# Patient Record
Sex: Female | Born: 1995 | Race: Black or African American | Hispanic: No | Marital: Single | Smoking: Never smoker
Health system: Southern US, Community
[De-identification: ages and names within clinical notes are randomized; demographics above are authoritative.]

## PROBLEM LIST (undated history)

## (undated) DIAGNOSIS — J45909 Unspecified asthma, uncomplicated: Secondary | ICD-10-CM

## (undated) DIAGNOSIS — D649 Anemia, unspecified: Secondary | ICD-10-CM

## (undated) DIAGNOSIS — E161 Other hypoglycemia: Secondary | ICD-10-CM

## (undated) DIAGNOSIS — F329 Major depressive disorder, single episode, unspecified: Secondary | ICD-10-CM

## (undated) DIAGNOSIS — M1990 Unspecified osteoarthritis, unspecified site: Secondary | ICD-10-CM

## (undated) DIAGNOSIS — F419 Anxiety disorder, unspecified: Secondary | ICD-10-CM

## (undated) DIAGNOSIS — F32A Depression, unspecified: Secondary | ICD-10-CM

## (undated) HISTORY — DX: Other hypoglycemia: E16.1

## (undated) HISTORY — PX: TONSILLECTOMY: SUR1361

## (undated) HISTORY — PX: WISDOM TOOTH EXTRACTION: SHX21

---
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
## 2017-07-18 ENCOUNTER — Emergency Department (HOSPITAL_COMMUNITY)
Admission: EM | Admit: 2017-07-18 | Discharge: 2017-07-18 | Disposition: A | Discharge status: Home / Self Care | Payer: BLUE CROSS/BLUE SHIELD | Attending: Emergency Medicine | Admitting: Emergency Medicine

## 2017-07-18 ENCOUNTER — Other Ambulatory Visit: Payer: Self-pay

## 2017-07-18 ENCOUNTER — Emergency Department (HOSPITAL_COMMUNITY): Payer: BLUE CROSS/BLUE SHIELD

## 2017-07-18 ENCOUNTER — Encounter (HOSPITAL_COMMUNITY): Payer: Self-pay | Admitting: *Deleted

## 2017-07-18 DIAGNOSIS — Z79899 Other long term (current) drug therapy: Secondary | ICD-10-CM | POA: Diagnosis not present

## 2017-07-18 DIAGNOSIS — M546 Pain in thoracic spine: Secondary | ICD-10-CM | POA: Diagnosis not present

## 2017-07-18 HISTORY — DX: Unspecified osteoarthritis, unspecified site: M19.90

## 2017-07-18 NOTE — ED Provider Notes (Signed)
[facility]-EMERGENCY DEPT Provider Note   CSN: [id number] Arrival date & time: 07/18/17  0859     History   Chief Complaint Chief Complaint  Patient presents with  . Back Pain    HPI Laura SparkLauryn Cowan is a 21 y.o. female.  HPI Patient presents with mid back pain.  Developed last night.  Worse with certain movements.  Somewhat worse with breathing.  No swelling in her legs.  Does not smoke.  No injury.  No different physical activity.  Has not really had pains like this before.  Has been persistent since yesterday. Past Medical History:  Diagnosis Date  . Arthritis     There are no active problems to display for this patient.   Past Surgical History:  Procedure Laterality Date  . TONSILLECTOMY      OB History    No data available       Home Medications    Prior to Admission medications   Medication Sig Start Date End Date Taking? Authorizing Provider  CAMRESE 0.15-0.03 &0.01 MG tablet Take 1 tablet daily by mouth. 05/12/17  Yes [provider]    Family History No family history on file.  Social History Social History   Tobacco Use  . Smoking status: Never Smoker  . Smokeless tobacco: Never Used  Substance Use Topics  . Alcohol use: No  . Drug use: No     Allergies   Nexplanon [etonogestrel]   Review of Systems Review of Systems  Constitutional: Positive for appetite change. Negative for fever.  HENT: Negative for congestion.   Respiratory: Negative for shortness of breath.   Cardiovascular: Negative for chest pain.  Gastrointestinal: Negative for abdominal pain.  Genitourinary: Negative for flank pain.  Musculoskeletal: Positive for back pain. Negative for joint swelling.  Skin: Negative for rash.  Neurological: Negative for seizures.  Hematological: Negative for adenopathy.  Psychiatric/Behavioral: Negative for confusion.     Physical Exam Updated Vital Signs BP 132/77 (BP Location: Left Arm)   Pulse 94    Temp 98.4 F (36.9 C) (Oral)   Resp 16   Ht 5\' 5"  (1.651 m)   Wt 136.1 kg (300 lb)   LMP 07/04/2017   SpO2 100%   BMI 49.92 kg/m   Physical Exam  Constitutional:  Patient is obese  HENT:  Head: Atraumatic.  Eyes: Pupils are equal, round, and reactive to light.  Neck: Neck supple.  Pulmonary/Chest: She exhibits no tenderness.  Abdominal: Soft. There is no tenderness.  Musculoskeletal: She exhibits tenderness. She exhibits no edema.  Tenderness over mid thoracic spine.  No deformity.  No rash.  No paraspinal tenderness.  Skin: Skin is warm. Capillary refill takes less than 2 seconds.     ED Treatments / Results  Labs (all labs ordered are listed, but only abnormal results are displayed) Labs Reviewed - No data to display  EKG  EKG Interpretation None       Radiology Dg Chest 2 View  Result Date: 07/18/2017 CLINICAL DATA:  Mid back pain for the past 2 days.  No known injury. EXAM: CHEST  2 VIEW COMPARISON:  None. FINDINGS: The heart size and mediastinal contours are within normal limits. Both lungs are clear. The visualized skeletal structures are unremarkable. IMPRESSION: Normal examination. Electronically Signed   By: Beckie SaltsSteven  Reid M.D.   On: 07/18/2017 13:26    Procedures Procedures (including critical care time)  Medications Ordered in ED Medications - No data to display   Initial Impression /  Assessment and Plan / ED Course  I have reviewed the triage vital signs and the nursing notes.  Pertinent labs & imaging results that were available during my care of the patient were reviewed by me and considered in my medical decision making (see chart for details).     Patient with mid back pain.  X-ray reassuring.  Not hypoxic.  Not tachycardic.  Worse with palpation of the spine.  No fevers.  Discussed with patient that this is likely musculoskeletal back pain.  Patient was going to be discharged home but eloped before she got her paperwork.  Final Clinical  Impressions(s) / ED Diagnoses   Final diagnoses:  Acute midline thoracic back pain    New Prescriptions This SmartLink is deprecated. Use AVSMEDLIST instead to display the medication list for a patient.   Benjiman Core, MD 07/18/17 1436

## 2017-07-18 NOTE — ED Notes (Signed)
Called for room placement x 2 with no answer.

## 2017-07-18 NOTE — ED Triage Notes (Addendum)
EMS states pt developed mid to upper back back pain last night, no injury

## 2017-07-18 NOTE — ED Notes (Signed)
Patient eloped with out waiting for discharged paper.

## 2018-06-14 DIAGNOSIS — E161 Other hypoglycemia: Secondary | ICD-10-CM

## 2018-06-14 HISTORY — DX: Other hypoglycemia: E16.1

## 2018-09-10 ENCOUNTER — Ambulatory Visit (HOSPITAL_COMMUNITY)
Admission: RE | Admit: 2018-09-10 | Discharge: 2018-09-10 | Disposition: A | Discharge status: Home / Self Care | Payer: BLUE CROSS/BLUE SHIELD | Attending: Psychiatry | Admitting: Psychiatry

## 2018-09-10 DIAGNOSIS — Z888 Allergy status to other drugs, medicaments and biological substances status: Secondary | ICD-10-CM | POA: Diagnosis not present

## 2018-09-10 DIAGNOSIS — F12929 Cannabis use, unspecified with intoxication, unspecified: Secondary | ICD-10-CM | POA: Diagnosis not present

## 2018-09-10 DIAGNOSIS — F339 Major depressive disorder, recurrent, unspecified: Secondary | ICD-10-CM | POA: Insufficient documentation

## 2018-09-10 NOTE — BH Assessment (Signed)
Assessment Note  Laura Cowan is an 22 y.o. female who was brought to [facility] by her Mother, Jenisha Faison.  Patient presented orientated x4, mood "sad", affect depressed and sad.  Patient denied current SI, HI, AVH.  Patient reports last episode of SI with no intent or plans was approximately a month ago when she was feeling "very sad."  Patient and Mother denied a history of SI attempts or inpatient psychiatric hospitalizations.  Patient reports starting to feel sad in 2014 when her Step Mother remarried her Father and did not tell her.  She reports having a very close relationship with Step Mother since childhood while visiting during the summer.  The Patient also reports moving  from another state with a Friend to attend [employer].  Patient reports her Friend transferred to another university out of state 2 years ago and that she has been sad since she left.  Patient reports self isolating, experiencing negative cognitions about herself, not grooming hair or wearing makeup as she has done in the past.  She reports a poor appetite and sleep difficulty as she does not go to bed until late at night.  Patient reports smoking Cannabis daily of a blunt to assist with sleep and lift her mood.  She reports stop smoking 2 weeks ago because it was not benefical.  Patient reports being seen at [facility] for medication management and therapy for depression.  Patient reports being prescribed Lexapro 10 mg for depression, however stopped taking it after 2 weeks because of reading about negative side effects.  Patient also reports not having a good rapport with Therapist whom she started seeing in September 2019.  She reports difficulty scheduling consistent appointments. The Mother, Tamyia Minich, provided collateral information.  Ms. Friske reports noticing the Patient being depressed approximately a year ago.  She reports thinking the Patient was depressed because her Friend moved out of state.  Ms. Vanderbeck  reports the Patient stopped grooming her hair and putting on make up and was staying in her room and sleeping a lot.  She reports the Patient was doing well at college with passing grades, however she was not socializing with others.  Ms. Formoso reports being aware that the Patient was smoking Cannabis and stopped approximately 2 weeks ago.  She also reports the Patient would stay awake for several days and then sleeping all day for several days.  Patient reports wanting to treatment on an outpatient basis.  Per Maryjean Morn, PA-C;  Patient does not meet inpatient criteria and provide with outpatient mental health resources for a new Therapist.         Diagnosis:  Major Depressive Disorder, recurrent, moderate; Cannabis Use Disorder, moderate  Past Medical History:  Past Medical History:  Diagnosis Date  . Arthritis     Past Surgical History:  Procedure Laterality Date  . TONSILLECTOMY      Family History: No family history on file.  Social History:  reports that she has never smoked. She has never used smokeless tobacco. She reports that she does not drink alcohol or use drugs.  Additional Social History:  Substance #1 Name of Substance 1: Cannabis 1 - Age of First Use: 21 years 1 - Amount (size/oz): 1 blunt 1 - Frequency: daily 1 - Duration: 1 year 1 - Last Use / Amount: 08/23/2018  CIWA:   COWS:    Allergies:  Allergies  Allergen Reactions  . Nexplanon [Etonogestrel] Itching, Swelling, Rash and Other (See  Comments)    swelling to face, headaches, sore throat, and swelling to glands    Home Medications: (Not in a hospital admission)   OB/GYN Status:  No LMP recorded. (Menstrual status: Other).  General Assessment Data [facility] Assessment: In system Is this a Tele or Face-to-Face Assessment?: Face-to-Face Is this an Initial Assessment or a Re-assessment for this encounter?: Initial Assessment Patient Accompanied by::  Parent(Mother) Language Other than English: No Living Arrangements: Other (Comment)(Home) What gender do you identify as?: Female Marital status: Single Maiden name: Hearne Pregnancy Status: No Living Arrangements: Parent(Lives with Mother) Can pt return to current living arrangement?: Yes Admission Status: Voluntary Is patient capable of signing voluntary admission?: Yes Referral Source: Self/Family/Friend Insurance type: Probation [employer] Cross and [employer]  Medical Screening Exam [facility]) Medical Exam completed: No Reason for MSE not completed: Other:([facility] Walk in)  Crisis Care Plan Living Arrangements: Parent(Lives with Mother) Legal Guardian: (Self) Name of Psychiatrist: None Name of Therapist: [facility]  Education Status Is patient currently in school?: Yes(College) Current Grade: Junior Highest grade of school patient has completed: 12th Name of school: [employer]  Risk to self with the past 6 months Suicidal Ideation: No-Not Currently/Within Last 6 Months Has patient been a risk to self within the past 6 months prior to admission? : Yes Suicidal Intent: No Has patient had any suicidal intent within the past 6 months prior to admission? : No Is patient at risk for suicide?: No Suicidal Plan?: No Has patient had any suicidal plan within the past 6 months prior to admission? : No Access to Means: No What has been your use of drugs/alcohol within the last 12 months?: Cannabis(for assistance to sleep and feel better) Previous Attempts/Gestures: No Triggers for Past Attempts: None known Intentional Self Injurious Behavior: None Family Suicide History: Unknown Recent stressful life event(s): Loss (Comment), Conflict (Comment)(, upset with Step Mother) Persecutory voices/beliefs?: No Depression: Yes Depression Symptoms: Feeling worthless/self pity, Loss of interest in usual pleasures, Isolating, Insomnia Substance abuse history and/or treatment for substance  abuse?: Yes(Cannabis) Suicide prevention information given to non-admitted patients: Not applicable  Risk to Others within the past 6 months Homicidal Ideation: No Does patient have any lifetime risk of violence toward others beyond the six months prior to admission? : No Thoughts of Harm to Others: No Current Homicidal Intent: No Current Homicidal Plan: No Access to Homicidal Means: No History of harm to others?: No Assessment of Violence: None Noted Does patient have access to weapons?: No Criminal Charges Pending?: No Does patient have a court date: No Is patient on probation?: No  Psychosis Hallucinations: None noted Delusions: None noted  Mental Status Report Appearance/Hygiene: Unremarkable Eye Contact: Fair Motor Activity: Unremarkable Speech: Logical/coherent, Soft Level of Consciousness: Alert Mood: Depressed, Sad Affect: Sad, Depressed Anxiety Level: None Thought Processes: Coherent, Relevant Judgement: Unimpaired Orientation: Person, Place, Time, Situation Obsessive Compulsive Thoughts/Behaviors: None  Cognitive Functioning Concentration: Good Memory: Recent Intact, Remote Intact Is patient IDD: No Insight: Good Impulse Control: Good Appetite: Poor Have you had any weight changes? : No Change Sleep: Decreased Total Hours of Sleep: 6 Vegetative Symptoms: Decreased grooming  ADLScreening [facility]) Patient's cognitive ability adequate to safely complete daily activities?: Yes Patient able to express need for assistance with ADLs?: Yes Independently performs ADLs?: Yes (appropriate for developmental age)  Prior Inpatient Therapy Prior Inpatient Therapy: No  Prior Outpatient Therapy Prior Outpatient Therapy: Yes Prior Therapy Dates: Current Prior Therapy  [facility] Reason for Treatment: Depression Does patient have an ACCT team?: No Does patient have Intensive In-House Services?  : No Does  patient have [facility] services? : No Does patient have P4CC services?: No  ADL Screening (condition at time of admission) Patient's cognitive ability adequate to safely complete daily activities?: Yes Is the patient deaf or have difficulty hearing?: No Does the patient have difficulty seeing, even when wearing glasses/contacts?: No Does the patient have difficulty concentrating, remembering, or making decisions?: No Patient able to express need for assistance with ADLs?: Yes Does the patient have difficulty dressing or bathing?: No Independently performs ADLs?: Yes (appropriate for developmental age) Does the patient have difficulty walking or climbing stairs?: No Weakness of Legs: None Weakness of Arms/Hands: None  Home Assistive Devices/Equipment Home Assistive Devices/Equipment: None      Values / Beliefs Cultural Requests During Hospitalization: None Spiritual Requests During Hospitalization: None   Advance Directives (For Healthcare) Does Patient Have a Medical Advance Directive?: No Would patient like information on creating a medical advance directive?: No - Patient declined          Disposition:  Disposition Initial Assessment Completed for this Encounter: Yes Disposition of Patient: Discharge Patient referred to: Outpatient clinic referral  On Site Evaluation by:   Reviewed with Physician:    Dey-Johnson,Rondall Radigan 09/10/2018 12:41 PM

## 2018-09-10 NOTE — H&P (Signed)
Behavioral Health Medical Screening Exam  Laura Cowan is an 22 y.o. female.  Total Time spent with patient: 15 minutes  Psychiatric Specialty Exam: Physical Exam  Vitals reviewed. Constitutional: She is oriented to person, place, and time. She appears well-developed and well-nourished. No distress.  Obese  HENT:  Head: Normocephalic and atraumatic.  Right Ear: External ear normal.  Left Ear: External ear normal.  Nose: Nose normal.  Eyes: Pupils are equal, round, and reactive to light. Conjunctivae are normal. Right eye exhibits no discharge. Left eye exhibits no discharge. No scleral icterus.  Neck: Normal range of motion. No JVD present. No tracheal deviation present. No thyromegaly present.  Cardiovascular: Normal rate, regular rhythm and normal heart sounds.  Respiratory: Effort normal. No stridor. No respiratory distress. She has no wheezes.  GI:  obese  Genitourinary:    Genitourinary Comments: deferred   Musculoskeletal: Normal range of motion.        General: No tenderness.  Neurological: She is alert and oriented to person, place, and time. No cranial nerve deficit.  Skin: Skin is warm and dry.  Psychiatric:  See MSE    Review of Systems  Constitutional: Negative for chills, diaphoresis, fever, malaise/fatigue and weight loss.  HENT: Negative for congestion, hearing loss, nosebleeds, sinus pain, sore throat and tinnitus.   Eyes: Negative for blurred vision, double vision, photophobia, pain, discharge and redness.  Respiratory: Negative for cough, hemoptysis, sputum production, shortness of breath, wheezing and stridor.   Cardiovascular: Negative for chest pain, palpitations, orthopnea, claudication, leg swelling and PND.  Gastrointestinal: Negative for abdominal pain, blood in stool, constipation, diarrhea, heartburn, melena, nausea and vomiting.  Genitourinary: Negative for dysuria, frequency, hematuria and urgency.  Musculoskeletal: Negative for back pain, falls,  joint pain, myalgias and neck pain.  Skin: Negative for itching and rash.  Neurological: Negative for dizziness, tremors, sensory change, focal weakness, seizures, loss of consciousness, weakness and headaches.  Endo/Heme/Allergies: Negative for environmental allergies and polydipsia. Does not bruise/bleed easily.  Psychiatric/Behavioral: Positive for depression and substance abuse (pot). Negative for hallucinations, memory loss and suicidal ideas. The patient is nervous/anxious. The patient does not have insomnia.     T-98.1 BP 124/70 P-70 R-18  General Appearance: Fairly Groomed and Shy  Eye Contact:  Good  Speech:  Soft/ uses short sentences  Volume:  Decreased  Mood:  Varies  Affect:  Congruent and Full Range  Thought Process:  Coherent and Descriptions of Associations: Intact  Orientation:  Full (Time, Place, and Person)  Thought Content:  WDL and Rumination  Suicidal Thoughts:  No  Homicidal Thoughts:  No  Memory:  Intact-grieving past  Judgement:  Intact  Insight:  Lacking  Psychomotor Activity:  Normal  Concentration: Concentration: Good and Attention Span: Good  Recall:  Good  Fund of Knowledge:WDL  Language: WDL  Akathisia:  NA  Handed:  Right  AIMS (if indicated):     Assets:  Desire for Improvement Financial Resources/Insurance Housing Physical Health Resilience Social Support Transportation Vocational/Educational  Sleep:   No complaint    Musculoskeletal: Strength & Muscle Tone: within normal limits Gait & Station: normal Patient leans: N/A  Recommendations:  Based on my evaluation the patient does not appear to have an emergency medical condition.  Maryjean Mornharles Neiko Trivedi, PA-C 09/10/2018, 1:12 PM

## 2018-09-12 ENCOUNTER — Telehealth (HOSPITAL_COMMUNITY): Payer: Self-pay | Admitting: General Practice

## 2018-09-13 ENCOUNTER — Telehealth (HOSPITAL_COMMUNITY): Payer: Self-pay | Admitting: General Practice

## 2018-11-02 ENCOUNTER — Telehealth (HOSPITAL_COMMUNITY): Payer: Self-pay | Admitting: General Practice

## 2018-11-21 ENCOUNTER — Ambulatory Visit (HOSPITAL_COMMUNITY): Payer: BLUE CROSS/BLUE SHIELD | Admitting: Psychiatry

## 2018-11-21 ENCOUNTER — Encounter (HOSPITAL_COMMUNITY): Payer: Self-pay | Admitting: Psychiatry

## 2018-11-21 VITALS — BP 110/68 | HR 82 | Ht 65.75 in | Wt 308.0 lb

## 2018-11-21 DIAGNOSIS — F411 Generalized anxiety disorder: Secondary | ICD-10-CM

## 2018-11-21 DIAGNOSIS — F410 Panic disorder [episodic paroxysmal anxiety] without agoraphobia: Secondary | ICD-10-CM

## 2018-11-21 DIAGNOSIS — F331 Major depressive disorder, recurrent, moderate: Secondary | ICD-10-CM

## 2018-11-21 MED ORDER — CLONAZEPAM 0.5 MG PO TABS: 0.5000 mg | ORAL_TABLET | Freq: Two times a day (BID) | ORAL | 0 refills | Status: DC | PRN

## 2018-11-21 MED ORDER — VILAZODONE HCL 20 MG PO TABS: 20.0000 mg | ORAL_TABLET | Freq: Every day | ORAL | 0 refills | Status: DC
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
## 2018-11-21 NOTE — Patient Instructions (Signed)
Plan:  1. Trial of Viibryd for depression (also anxiety). Take 10 mg in AM for 7 days then 20 mg at the same time of the day (start with starter pack).  2. Clonazepam 0.5 mg as needed for sleep (you can also use it as needed for panic attacks but it may cause sedation so in that case try 1/2 tablet).

## 2018-11-29 ENCOUNTER — Ambulatory Visit (HOSPITAL_COMMUNITY): Payer: BLUE CROSS/BLUE SHIELD | Admitting: Psychiatry

## 2018-12-06 ENCOUNTER — Ambulatory Visit (HOSPITAL_COMMUNITY): Payer: BLUE CROSS/BLUE SHIELD | Admitting: Psychiatry

## 2018-12-22 ENCOUNTER — Ambulatory Visit (HOSPITAL_COMMUNITY): Payer: BLUE CROSS/BLUE SHIELD | Admitting: Psychiatry

## 2018-12-23 ENCOUNTER — Emergency Department (HOSPITAL_COMMUNITY): Payer: BLUE CROSS/BLUE SHIELD

## 2018-12-23 ENCOUNTER — Emergency Department (HOSPITAL_COMMUNITY)
Admission: EM | Admit: 2018-12-23 | Discharge: 2018-12-23 | Disposition: A | Discharge status: Home / Self Care | Payer: BLUE CROSS/BLUE SHIELD | Attending: Emergency Medicine | Admitting: Emergency Medicine

## 2018-12-23 ENCOUNTER — Other Ambulatory Visit: Payer: Self-pay

## 2018-12-23 DIAGNOSIS — F411 Generalized anxiety disorder: Secondary | ICD-10-CM | POA: Diagnosis not present

## 2018-12-23 DIAGNOSIS — Y999 Unspecified external cause status: Secondary | ICD-10-CM | POA: Insufficient documentation

## 2018-12-23 DIAGNOSIS — W19XXXA Unspecified fall, initial encounter: Secondary | ICD-10-CM

## 2018-12-23 DIAGNOSIS — M25562 Pain in left knee: Secondary | ICD-10-CM | POA: Diagnosis not present

## 2018-12-23 DIAGNOSIS — Y9301 Activity, walking, marching and hiking: Secondary | ICD-10-CM | POA: Diagnosis not present

## 2018-12-23 DIAGNOSIS — S8992XA Unspecified injury of left lower leg, initial encounter: Secondary | ICD-10-CM | POA: Diagnosis present

## 2018-12-23 DIAGNOSIS — Z79899 Other long term (current) drug therapy: Secondary | ICD-10-CM | POA: Insufficient documentation

## 2018-12-23 DIAGNOSIS — Y929 Unspecified place or not applicable: Secondary | ICD-10-CM | POA: Diagnosis not present

## 2018-12-23 DIAGNOSIS — W1830XA Fall on same level, unspecified, initial encounter: Secondary | ICD-10-CM | POA: Insufficient documentation

## 2018-12-23 LAB — I-STAT CREATININE, ED: Creatinine, Ser: 0.7 mg/dL (ref 0.44–1.00)

## 2018-12-23 LAB — I-STAT BETA HCG BLOOD, ED (MC, WL, AP ONLY): I-stat hCG, quantitative: <5 m[IU]/mL (ref <5)

## 2018-12-23 MED ORDER — IOHEXOL 350 MG/ML SOLN
100.0000 mL | Freq: Once | INTRAVENOUS | Status: AC | PRN
  Administered 2018-12-23: 100 mL via INTRAVENOUS

## 2018-12-23 MED ORDER — IBUPROFEN 800 MG PO TABS
800.0000 mg | ORAL_TABLET | Freq: Once | ORAL | Status: AC
  Administered 2018-12-23: 800 mg via ORAL
  Filled 2018-12-23: qty 1

## 2018-12-23 MED ORDER — OXYCODONE-ACETAMINOPHEN 5-325 MG PO TABS
1.0000 | ORAL_TABLET | ORAL | Status: DC | PRN
  Administered 2018-12-23: 1 via ORAL
  Filled 2018-12-23: qty 1

## 2018-12-23 NOTE — ED Triage Notes (Signed)
Patient states she stepped off a curve, twisted her knee and fell onto her side earlier today. Patient was able to bear weight but states it's become increasingly difficult since and feels it popping. Patient tearful in triage. No other injuries.

## 2018-12-23 NOTE — ED Provider Notes (Signed)
[facility] EMERGENCY DEPARTMENT Provider Note   CSN: [id number] Arrival date & time: 12/23/18  1636    History   Chief Complaint Chief Complaint  Patient presents with  . Fall    HPI Laura Cowan is a 23 y.o. female.     The history is provided by the patient. No language interpreter was used.  Fall    Laura Cowan is a 23 y.o. female who presents to the Emergency Department complaining of fall. He presents the emergency department complaining of severe knee pain that occurred following a fall. She states that she was walking in her foot slipped, causing her left knee to twist. She fell to the ground on her side. She reports severe pain to her left knee. She is unsure if it popped out of place or not. She states that the pain has significantly worsened since it began. Pain is located throughout the knee, but predominantly in the back behind her knee. The pain at times shoots down her calf. Pain is worse with standing, walking. She does have a history of tendinitis in the knee but no similar pain in the past. Past Medical History:  Diagnosis Date  . Arthritis   . Hyperinsulinemia 06/2018    Patient Active Problem List   Diagnosis Date Noted  . Major depressive disorder, recurrent episode, moderate (HCC) 11/21/2018  . GAD (generalized anxiety disorder) 11/21/2018  . Panic disorder 11/21/2018    Past Surgical History:  Procedure Laterality Date  . TONSILLECTOMY       OB History   No obstetric history on file.      Home Medications    Prior to Admission medications   Medication Sig Start Date End Date Taking? Authorizing Provider  CAMRESE 0.15-0.03 &0.01 MG tablet Take 1 tablet daily by mouth. 05/12/17   [provider]  clonazePAM (KLONOPIN) 0.5 MG tablet Take 1 tablet (0.5 mg total) by mouth 2 (two) times daily as needed for up to 30 days for anxiety (sleep). 11/21/18 12/21/18  Pucilowski, Roosvelt Maser, MD  metFORMIN (GLUCOPHAGE) 500 MG tablet  Take by mouth daily with breakfast.    [provider]  Vilazodone HCl 20 MG TABS Take 1 tablet (20 mg total) by mouth daily for 30 days. 11/21/18 12/21/18  Pucilowski, Roosvelt Maser, MD    Family History Family History  Problem Relation Age of Onset  . Alcohol abuse Maternal Uncle   . Dementia Maternal Grandmother     Social History Social History   Tobacco Use  . Smoking status: Never Smoker  . Smokeless tobacco: Never Used  Substance Use Topics  . Alcohol use: No  . Drug use: Yes    Types: Marijuana    Comment: Last use 3-4 months prior     Allergies   Nexplanon [etonogestrel]   Review of Systems Review of Systems  All other systems reviewed and are negative.    Physical Exam Updated Vital Signs BP (!) 136/91 (BP Location: Right Arm)   Pulse (!) 115   Temp 98.2 F (36.8 C) (Oral)   Resp 20   LMP 11/25/2018 (Approximate)   SpO2 92%   Physical Exam Vitals signs and nursing note reviewed.  Constitutional:      Appearance: Normal appearance.  HENT:     Head: Normocephalic and atraumatic.  Neck:     Musculoskeletal: Neck supple.  Cardiovascular:     Rate and Rhythm: Regular rhythm.     Comments: tachycardic Pulmonary:  Effort: Pulmonary effort is normal. No respiratory distress.  Musculoskeletal:     Comments: 2+ DP pulses bilaterally. There is severe tenderness to palpation throughout the medial and lateral left knee as well as the popliteal fossa. She is able to flex and extend the knee. She does have significant pain on knee flexion.  Skin:    General: Skin is warm and dry.  Neurological:     General: No focal deficit present.     Mental Status: She is alert and oriented to person, place, and time.     Comments: Five out of five strength in bilateral lower extremities with sensation to light touch intact in bilateral lower extremities  Psychiatric:        Mood and Affect: Mood normal.        Behavior: Behavior normal.      ED Treatments /  Results  Labs (all labs ordered are listed, but only abnormal results are displayed) Labs Reviewed  I-STAT CREATININE, ED  I-STAT BETA HCG BLOOD, ED ([facility], [facility], [facility] ONLY)    EKG None  Radiology Dg Knee Complete 4 Views Left  Result Date: 12/23/2018 CLINICAL DATA:  Fall knee pain EXAM: LEFT KNEE - COMPLETE 4+ VIEW COMPARISON:  None. FINDINGS: No evidence of fracture, dislocation, or joint effusion. No evidence of arthropathy or other focal bone abnormality. Soft tissues are unremarkable. IMPRESSION: Negative. Electronically Signed   By: Marlan Palauharles  Clark M.D.   On: 12/23/2018 17:32    Procedures Procedures (including critical care time)  Medications Ordered in ED Medications  oxyCODONE-acetaminophen (PERCOCET/ROXICET) 5-325 MG per tablet 1 tablet (1 tablet Oral Given 12/23/18 1651)  ibuprofen (ADVIL,MOTRIN) tablet 800 mg (800 mg Oral Given 12/23/18 1800)     Initial Impression / Assessment and Plan / ED Course  I have reviewed the triage vital signs and the nursing notes.  Pertinent labs & imaging results that were available during my care of the patient were reviewed by me and considered in my medical decision making (see chart for details).       Patient here for evaluation of left knee pain after a twisting injury, she is unsure if there was a dislocation that took place. She does have significant tenderness on examination with good distal pulses. Difficulty in performing testing to assess for ligamentous laxity given patient's body habitus. Plan to obtain CTA to rule out popliteal artery injury.  Pt care transferred pending CTA. Final Clinical Impressions(s) / ED Diagnoses   Final diagnoses:  Fall    ED Discharge Orders    None       Tilden Fossaees, Keyarah Mcroy, MD 12/23/18 [phone]

## 2018-12-23 NOTE — ED Provider Notes (Signed)
Patient care assumed from Dr. Madilyn Hookees pending results of CT a of the left lower extremity.  Briefly, patient had fall prior to arrival and sustained injury of the left knee.  Given significant pain, CT was ordered to rule out popliteal injury.  Plan is to follow-up on results of CT imaging, if negative patient can be discharged with knee immobilizer and crutches.    Physical Exam  BP 125/83   Pulse 100   Temp 98.2 F (36.8 C) (Oral)   Resp 20   LMP 11/25/2018 (Approximate)   SpO2 97%   Physical Exam Vitals signs and nursing note reviewed.  Constitutional:      General: She is not in acute distress.    Appearance: She is well-developed.  HENT:     Head: Normocephalic and atraumatic.  Eyes:     Conjunctiva/sclera: Conjunctivae normal.  Neck:     Musculoskeletal: Neck supple.  Cardiovascular:     Rate and Rhythm: Normal rate.  Pulmonary:     Effort: Pulmonary effort is normal.  Musculoskeletal: Normal range of motion.  Skin:    General: Skin is warm and dry.  Neurological:     Mental Status: She is alert.     ED Course/Procedures     Procedures Results for orders placed or performed during the hospital encounter of 12/23/18  I-Stat Creatinine, ED (not [facility])  Result Value Ref Range   Creatinine, Ser 0.70 0.44 - 1.00 mg/dL  I-Stat beta hCG blood, ED  Result Value Ref Range   I-stat hCG, quantitative <5.0 <5 mIU/mL   Comment 3           Ct Angio Low Extrem Left W &/or Wo Contrast  Result Date: 12/23/2018 CLINICAL DATA:  Left leg pain after fall today. EXAM: CT ANGIOGRAPHY OF THE left lowerEXTREMITY TECHNIQUE: Multidetector CT imaging of the left lowerwas performed using the standard protocol during bolus administration of intravenous contrast. Multiplanar CT image reconstructions and MIPs were obtained to evaluate the vascular anatomy. CONTRAST:  100mL OMNIPAQUE IOHEXOL 350 MG/ML SOLN COMPARISON:  Radiographs of same day. FINDINGS: There is no evidence of fracture or other  bony abnormality involving the left lower extremity. No definite soft tissue abnormality is noted. The iliac, femoral, popliteal, tibial and peroneal arteries are widely patent. No extravasation or injury is noted. Review of the MIP images confirms the above findings. IMPRESSION: No definite abnormality seen in the left lower extremity. Electronically Signed   By: Lupita RaiderJames  Green Jr, M.D.   On: 12/23/2018 20:25   Dg Knee Complete 4 Views Left  Result Date: 12/23/2018 CLINICAL DATA:  Fall knee pain EXAM: LEFT KNEE - COMPLETE 4+ VIEW COMPARISON:  None. FINDINGS: No evidence of fracture, dislocation, or joint effusion. No evidence of arthropathy or other focal bone abnormality. Soft tissues are unremarkable. IMPRESSION: Negative. Electronically Signed   By: Marlan Palauharles  Clark M.D.   On: 12/23/2018 17:32     MDM   Patient with fall prior to arrival and left knee injury.  Given significant pain CT imaging of the left knee ordered to rule out popliteal injury.  CTA does not show any evidence of this.  X-ray was also negative.  No bony abnormalities.  Patient placed in knee immobilizer and given follow-up with orthopedics.  Also given crutches and advised not to bear weight until she can follow-up with orthopedics.  Advised Tylenol Motrin for symptoms.  Return precautions discussed.  She voiced understanding of the plan and reasons to return.  All  questions answered.  Patient stable at discharge.   Rayne Du 12/24/18 0016    Tilden Fossa, MD 12/24/18 1121

## 2018-12-23 NOTE — Discharge Instructions (Addendum)
You may alternate taking Tylenol and Ibuprofen as needed for pain control. You may take 400-600 mg of ibuprofen every 6 hours and [id number] mg of Tylenol every 6 hours. Do not exceed 4000 mg of Tylenol daily as this can lead to liver damage. Also, make sure to take Ibuprofen with meals as it can cause an upset stomach. Do not take other NSAIDs while taking Ibuprofen such as (Aleve, Naprosyn, Aspirin, Celebrex, etc) and do not take more than the prescribed dose as this can lead to ulcers and bleeding in your GI tract. You may use warm and cold compresses to help with your symptoms.   You were given a referral to an orthopedic doctor.  Please do not bear weight on your left leg until you can make an appointment to follow-up with the orthopedic doctor.  Please return to the ER sooner if you have any new or worsening symptoms.

## 2018-12-23 NOTE — ED Notes (Signed)
IV team at bedside 

## 2018-12-28 ENCOUNTER — Ambulatory Visit (INDEPENDENT_AMBULATORY_CARE_PROVIDER_SITE_OTHER): Payer: BLUE CROSS/BLUE SHIELD | Admitting: Psychiatry

## 2018-12-28 ENCOUNTER — Other Ambulatory Visit: Payer: Self-pay

## 2018-12-28 DIAGNOSIS — F410 Panic disorder [episodic paroxysmal anxiety] without agoraphobia: Secondary | ICD-10-CM

## 2018-12-28 DIAGNOSIS — F3341 Major depressive disorder, recurrent, in partial remission: Secondary | ICD-10-CM | POA: Diagnosis not present

## 2018-12-28 DIAGNOSIS — F411 Generalized anxiety disorder: Secondary | ICD-10-CM

## 2018-12-28 MED ORDER — VILAZODONE HCL 20 MG PO TABS: 20.0000 mg | ORAL_TABLET | Freq: Every day | ORAL | 0 refills | Status: DC

## 2018-12-28 MED ORDER — CLONAZEPAM 0.5 MG PO TABS: 0.5000 mg | ORAL_TABLET | Freq: Every evening | ORAL | 0 refills | Status: DC | PRN

## 2018-12-28 NOTE — Progress Notes (Signed)
BH MD/PA/NP OP Progress Note  12/28/2018 2:26 PM Heide SparkLauryn Akens  MRN:  [id number] Interview was conducted using teleconferencing and I verified that I was speaking with the correct person using two identifiers. I discussed the limitations of evaluation and management by telemedicine and  the availability of in person appointments. Patient expressed understanding and agreed to proceed.  Chief Complaint: Mild anxiety.  HPI: 23 yo single AAF with symptoms consistent with MDD, GAD (and panic disorder) who has never been treated until middle of last year. She started to have SI on Lexapro; was in counseling but sessions were inconsistent. Still depressed and anxious. Panic attacks less frequent than before. Poor sleep but improved since she started to take 0.5 mg of clonazepam at night. No hx of mania or psychosis. 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 Lives with mother - relationship is satisfactory. Junie PanningLauryn likes to draw to relax but has not done a lot of it lately. Hx of cannabis abuse - in early remission. We started Viibryd for depression/anxiety: 10 mg daily x 1 week, then 20 mg daily (2 week starter pack plus one month prescription given). Her mood has markedly improved - depression practically in remission, no recent panic attacks so she only uses clonazepam for sleep. She fell a week ago went ED and still has left knee pain.  Visit Diagnosis:    ICD-10-CM   1. Major depressive disorder, recurrent episode, in partial remission (HCC) F33.41   2. GAD (generalized anxiety disorder) F41.1   3. Panic disorder F41.0     Past Psychiatric History: Please refer to intake H&P.  Past Medical History:  Past Medical History:  Diagnosis Date  . Arthritis   . 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 TONSILLECTOMY      Family Psychiatric History: Reviewed.  Family History:  Family History   Problem Relation Age of Onset  . Alcohol abuse Maternal Uncle   . Dementia Maternal Grandmother     Social History:  Social History   Socioeconomic History  . Marital status: Single    Spouse name: Not on file  . Number of children: Not on file  . Years of education: Not on file  . Highest education level: Not on file  Occupational History  . Not on file  Social Needs  . Financial resource strain: Not hard at all  . Food insecurity:    Worry: Never true    Inability: Never true  . Transportation needs:    Medical: No    Non-medical: Yes  Tobacco Use  . Smoking status: Never Smoker  . Smokeless tobacco: Never Used  Substance and Sexual Activity  . Alcohol use: No  . Drug use: Yes    Types: Marijuana    Comment: Last use 3-4 months prior  . Sexual activity: Not Currently  Lifestyle  . Physical activity:    Days per week: 5 days    Minutes per session: 20 min  . Stress: To some extent  Relationships  . Social connections:    Talks on phone: Once a week    Gets together: Never    Attends religious service: Never    Active member of club or organization: No    Attends meetings of clubs or organizations: Never    Relationship status: Never married  Other Topics Concern  . Not on file  Social History Narrative  .  Not on file    Allergies:  Allergies  Allergen Reactions  . Nexplanon [Etonogestrel] Itching, Swelling, Rash and Other (See Comments)    Swelling of face and glands, headaches, and a sore throat    Metabolic Disorder Labs: No results found for: HGBA1C, MPG No results found for: PROLACTIN No results found for: CHOL, TRIG, HDL, CHOLHDL, VLDL, LDLCALC No results found for: TSH  Therapeutic Level Labs: No results found for: LITHIUM No results found for: VALPROATE No components found for:  CBMZ  Current Medications: Current Outpatient Medications  Medication Sig Dispense Refill  . clonazePAM (KLONOPIN) 0.5 MG tablet Take 1 tablet (0.5 mg total)  by mouth 2 (two) times daily as needed for up to 30 days for anxiety (sleep). (Patient taking differently: Take 0.5 mg by mouth 2 (two) times daily as needed (for anxiety or sleep). ) 30 tablet 0  . ferrous sulfate 325 (65 FE) MG tablet Take 325 mg by mouth daily with breakfast.    . metFORMIN (GLUCOPHAGE) 500 MG tablet Take 500 mg by mouth 2 (two) times daily with a meal.     . Vilazodone HCl 20 MG TABS Take 1 tablet (20 mg total) by mouth daily for 30 days. 30 tablet 0   No current facility-administered medications for this visit.      Musculoskeletal:  Psychiatric Specialty Exam: Review of Systems  Musculoskeletal: Positive for joint pain.  Psychiatric/Behavioral: The patient is nervous/anxious.     There were no vitals taken for this visit.There is no height or weight on file to calculate BMI.  General Appearance: NA  Eye Contact:  NA  Speech:  Clear and Coherent  Volume:  Normal  Mood:  Anxious  Affect:  NA  Thought Process:  Goal Directed  Orientation:  Full (Time, Place, and Person)  Thought Content: Logical   Suicidal Thoughts:  No  Homicidal Thoughts:  No  Memory:  Immediate;   Good Recent;   Good Remote;   Good  Judgement:  Intact  Insight:  Good  Psychomotor Activity:  NA  Concentration:  Concentration: Good  Recall:  Good  Fund of Knowledge: Good  Language: Good  Akathisia:  NA  Handed:  Right  AIMS (if indicated): not done  Assets:  Communication Skills Desire for Improvement Financial Resources/Insurance Housing Physical Health Social Support Vocational/Educational  ADL's:  Intact  Cognition: WNL  Sleep:  Fair     Assessment and Plan: 23 yo single AAF with symptoms consistent with MDD, GAD (and panic disorder) who has never been treated until middle of last year. She started to have SI on Lexapro; was in counseling but sessions were inconsistent. Still depressed and anxious. Panic attacks less frequent than before. Poor sleep but improved since she  started to take 0.5 mg of clonazepam at night. No hx of mania or psychosis. 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 Lives with mother - relationship is satisfactory. Iris likes to draw to relax but has not done a lot of it lately. Hx of cannabis abuse - in early remission. We started Viibryd for depression/anxiety: 10 mg daily x 1 week, then 20 mg daily (2 week starter pack plus one month prescription given). Her mood has markedly improved - depression practically in remission, no recent panic attacks so she only uses clonazepam for sleep. She fell a week ago went ED and still has left knee pain.  Dx impression: GAD;  MDD recurrent in partial remission; Cannabis use disorder mild, in remission  Plan: Continue Viibryd 20 mg daily and clonazepam 0.5 mg at HS prn insomnia. Next med management visit in 3 months or prn.  Magdalene Patricia, MD 12/28/2018, 2:26 PM

## 2018-12-30 ENCOUNTER — Ambulatory Visit (INDEPENDENT_AMBULATORY_CARE_PROVIDER_SITE_OTHER): Payer: BLUE CROSS/BLUE SHIELD | Admitting: Orthopedic Surgery

## 2018-12-30 ENCOUNTER — Encounter (INDEPENDENT_AMBULATORY_CARE_PROVIDER_SITE_OTHER): Payer: Self-pay | Admitting: Orthopedic Surgery

## 2018-12-30 ENCOUNTER — Other Ambulatory Visit: Payer: Self-pay

## 2018-12-30 VITALS — Ht 65.0 in | Wt 308.0 lb

## 2018-12-30 DIAGNOSIS — S838X2A Sprain of other specified parts of left knee, initial encounter: Secondary | ICD-10-CM

## 2018-12-30 NOTE — Addendum Note (Signed)
Addended byPrescott Parma on: 12/30/2018 10:49 AM   Modules accepted: Orders

## 2018-12-30 NOTE — Progress Notes (Signed)
Office Visit Note   Patient: Laura Cowan           Date of Birth: 09-27-1995           MRN: [id number] Visit Date: 12/30/2018 Requested by: No referring provider defined for this encounter. PCP: Patient, No Pcp Per  Subjective: Chief Complaint  Patient presents with  . Left Knee - Pain    HPI: Patient presents after injury to her left knee.  She slipped and fell while walking on 12/23/2018.  Went to the emergency room.  CT scan negative.  She reports continued pain and weakness.  Taking Tylenol Motrin as needed.  She is really unable to weight-bear because of valgus type collapse of the left knee.  She denies any other orthopedic complaint.              ROS: All systems reviewed are negative as they relate to the chief complaint within the history of present illness.  Patient denies  fevers or chills.   Assessment & Plan: Visit Diagnoses:  1. Injury of meniscus of left knee, initial encounter     Plan: Impression is hyperflexion valgus type injury to the left knee.  She does have an effusion.  No definite fracture is present.  Her ACL actually feels intact on exam today but that is a difficult exam due to guarding.  I think that she does have valgus laxity on exam.  Need to evaluate the MCL for complete tear versus incomplete tear.  Needs MRI scan basically because of the effusion and inability to weight-bear.  She may have a lateral meniscal tear which is also giving her trouble.  Is having a little bit of trouble with full hyperextension on the left compared to the right.  I will see her back after that study.  Follow-Up Instructions: Return for after MRI.   Orders:  No orders of the defined types were placed in this encounter.  No orders of the defined types were placed in this encounter.     Procedures: No procedures performed   Clinical Data: No additional findings.  Objective: Vital Signs: Ht 5\' 5"  (1.651 m)   Wt (!) 308 lb (139.7 kg)   BMI 51.25 kg/m    Physical Exam:   Constitutional: Patient appears well-developed HEENT:  Head: Normocephalic Eyes:EOM are normal Neck: Normal range of motion Cardiovascular: Normal rate Pulmonary/chest: Effort normal Neurologic: Patient is alert Skin: Skin is warm Psychiatric: Patient has normal mood and affect    Ortho Exam: Ortho exam demonstrates mild effusion left knee.  She does lack about 5 to 10 degrees of full extension with about 5 degrees of hyperextension on the right.  MCL is lax in both extension and 30 degrees of valgus testing.  Varus testing demonstrates intact lateral collateral ligament.  PCL and ACL feel intact but is somewhat of a guarded exam.  She does have good ankle dorsiflexion plantarflexion strength and palpable pedal pulses bilaterally.  She does have a little swelling in that left leg but no calf tenderness.  She had a very tight co-band around the knee.  Specialty Comments:  No specialty comments available.  Imaging: No results found.   PMFS History: Patient Active Problem List   Diagnosis Date Noted  . Major depressive disorder, recurrent episode, moderate (HCC) 11/21/2018  . GAD (generalized anxiety disorder) 11/21/2018  . Panic disorder 11/21/2018   Past Medical History:  Diagnosis Date  . Arthritis   . 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 Alcohol abuse Maternal Uncle   . Dementia Maternal Grandmother     Past Surgical History:  Procedure Laterality Date  . TONSILLECTOMY     Social History   Occupational History  . Not on file  Tobacco Use  . Smoking status: Never Smoker  . Smokeless tobacco: Never Used  Substance and Sexual Activity  . Alcohol use: No  . Drug use: Yes    Types: Marijuana    Comment: Last use 3-4 months prior  . Sexual activity: Not Currently

## 2019-01-04 ENCOUNTER — Ambulatory Visit
Admission: RE | Admit: 2019-01-04 | Discharge: 2019-01-04 | Disposition: A | Discharge status: Home / Self Care | Payer: BLUE CROSS/BLUE SHIELD | Source: Ambulatory Visit | Attending: Orthopedic Surgery | Admitting: Orthopedic Surgery

## 2019-01-04 ENCOUNTER — Other Ambulatory Visit: Payer: Self-pay

## 2019-01-04 DIAGNOSIS — S838X2A Sprain of other specified parts of left knee, initial encounter: Secondary | ICD-10-CM

## 2019-01-05 ENCOUNTER — Ambulatory Visit (INDEPENDENT_AMBULATORY_CARE_PROVIDER_SITE_OTHER): Payer: BLUE CROSS/BLUE SHIELD | Admitting: Orthopedic Surgery

## 2019-01-05 ENCOUNTER — Encounter (INDEPENDENT_AMBULATORY_CARE_PROVIDER_SITE_OTHER): Payer: Self-pay | Admitting: Orthopedic Surgery

## 2019-01-05 DIAGNOSIS — S838X2D Sprain of other specified parts of left knee, subsequent encounter: Secondary | ICD-10-CM

## 2019-01-05 MED ORDER — OXYCODONE HCL 5 MG PO TABS: ORAL_TABLET | ORAL | 0 refills | Status: DC

## 2019-01-05 MED ORDER — METHOCARBAMOL 500 MG PO TABS: ORAL_TABLET | ORAL | 0 refills | Status: DC

## 2019-01-05 MED ORDER — ASPIRIN EC 81 MG PO TBEC: DELAYED_RELEASE_TABLET | ORAL | 0 refills | Status: DC

## 2019-01-05 NOTE — Progress Notes (Signed)
Office Visit Note   Patient: Laura Cowan           Date of Birth: 1996-02-12           MRN: [id number] Visit Date: 01/05/2019 Requested by: No referring provider defined for this encounter. PCP: Patient, No Pcp Per  Subjective: Chief Complaint  Patient presents with  . Left Knee - Pain    HPI: Patient presents for follow-up of left knee MRI scan.  The MRI scan shows near complete evulsion of the popliteus tendon from its attachment site on the femur.  There is some laxity seen on the sagittal images of the MRI scan.  Patient also has a medial meniscal tear radial type.  There is also bone bruising in the effusion.  ACL PCL MCL intact.  A little bit of sprain but no distinct tearing of the lateral ligaments or other posterior lateral knee structures.  She is having instability in the knee when she is walking.  Her mother is on the phone today and we all talked about the case.  Reviewed the MRI scan with [name] as well.              ROS: All systems reviewed are negative as they relate to the chief complaint within the history of present illness.  Patient denies  fevers or chills.   Assessment & Plan: Visit Diagnoses:  1. Injury of meniscus of left knee, subsequent encounter     Plan: Impression is left knee popliteal ligament avulsion from the femur with some rotational instability mild left versus right.  ACL PCL intact.  Medial meniscal tear also present.  Lan at this time after discussion with [name] and her mother his arthroscopy with meniscal debridement followed by open popliteal ligament repair.  Risk and benefits are discussed including but not limited to infection knee stiffness as well as potentially continued knee instability feelings.  Patient understands the risk and benefits.  All questions answered.  Pain prescriptions done today.  She is not currently on hormone treatment and she has no personal or family history of DVT or pulmonary embolism.  Follow-Up Instructions: No  follow-ups on file.   Orders:  No orders of the defined types were placed in this encounter.  Meds ordered this encounter  Medications  . oxyCODONE (ROXICODONE) 5 MG immediate release tablet    Sig: 1 po q 6 hrs prn pain    Dispense:  35 tablet    Refill:  0  . methocarbamol (ROBAXIN) 500 MG tablet    Sig: 1 po q 8 hrs prn spasm    Dispense:  30 tablet    Refill:  0  . aspirin EC 81 MG tablet    Sig: 1 po bid    Dispense:  42 tablet    Refill:  0      Procedures: No procedures performed   Clinical Data: No additional findings.  Objective: Vital Signs: There were no vitals taken for this visit.  Physical Exam:   Constitutional: Patient appears well-developed HEENT:  Head: Normocephalic Eyes:EOM are normal Neck: Normal range of motion Cardiovascular: Normal rate Pulmonary/chest: Effort normal Neurologic: Patient is alert Skin: Skin is warm Psychiatric: Patient has normal mood and affect    Ortho Exam: Ortho exam demonstrates full active and passive range of motion of the right knee.  On the left knee and effusion is present.  She does have full extension as well as knee flexion to 90.  Collaterals feel stable but  there is a little bit more rotation on the left than the right at 30 degrees of flexion.  Negative patellar apprehension.  She has good collateral ligament stability on the medial side and slightly more laxity to varus stress testing today on exam than previously examined.  Again examination is a little bit difficult due to guarding.  However the rotational component does feel like his increased left knee versus right.  This is consistent with her injury.  Specialty Comments:  No specialty comments available.  Imaging: No results found.   PMFS History: Patient Active Problem List   Diagnosis Date Noted  . Major depressive disorder, recurrent episode, moderate (HCC) 11/21/2018  . GAD (generalized anxiety disorder) 11/21/2018  . Panic disorder  11/21/2018   Past Medical History:  Diagnosis Date  . Arthritis   . 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 Alcohol abuse Maternal Uncle   . Dementia Maternal Grandmother     Past Surgical History:  Procedure Laterality Date  . TONSILLECTOMY     Social History   Occupational History  . Not on file  Tobacco Use  . Smoking status: Never Smoker  . Smokeless tobacco: Never Used  Substance and Sexual Activity  . Alcohol use: No  . Drug use: Yes    Types: Marijuana    Comment: Last use 3-4 months prior  . Sexual activity: Not Currently

## 2019-01-06 ENCOUNTER — Other Ambulatory Visit: Payer: Self-pay

## 2019-01-06 ENCOUNTER — Encounter (HOSPITAL_COMMUNITY): Payer: Self-pay | Admitting: *Deleted

## 2019-01-06 NOTE — Progress Notes (Addendum)
Spoke with pt and her mother, Corrie Dandy for pre-op call. Pt denies cardiac history. Pt has Hyperinsulinemia, not diabetic. Pt is on Metformin. Last A1C was 3 months ago and it was 5.8. Pt sees a physician at [facility].   Coronavirus Screening  Have you experienced the following symptoms:  Cough no Fever (>100.5F)  no Runny nose no Sore throat no Difficulty breathing/shortness of breath  no  Have you or a family member traveled in the last 14 days and where? no

## 2019-01-09 NOTE — H&P (Signed)
Laura Cowan is an 23 y.o. female.   Chief Complaint: left knee pain. HPI: Patient presents for evaluation of left knee pain.  She had a twisting injury about a week ago.  MRI scan shows popliteal tendon avulsion off the femur as well as medial meniscal tear.  She reports pain swelling and instability in the left knee.  ACL PCL intact.  There was some strain of the conjoined tendon and LCL but no discrete tear.  She has no personal or family history of DVT or pulmonary embolism.  She does report difficulty ambulating since the injury.  Past Medical History:  Diagnosis Date  . Anemia    while on BCP  . Anxiety   . Arthritis   . Asthma    as a toddler  . Depression   . 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 TONSILLECTOMY     and adenoids removed  . WISDOM TOOTH EXTRACTION      Family History  Problem Relation Age of Onset  . Alcohol abuse Maternal Uncle   . Dementia Maternal Grandmother    Social History:  reports that she has never smoked. She has never used smokeless tobacco. She reports current alcohol use. She reports current drug use. Drug: Marijuana.  Allergies:  Allergies  Allergen Reactions  . Nexplanon [Etonogestrel] Itching, Swelling, Rash and Other (See Comments)    Swelling of face and glands, headaches, and a sore throat    No medications prior to admission.    No results found for this or any previous visit (from the past 48 hour(s)). No results found.  Review of Systems  Musculoskeletal: Positive for joint pain.  All other systems reviewed and are negative.   Last menstrual period 11/27/2018. Physical Exam  Constitutional: She appears well-developed.  HENT:  Head: Normocephalic.  Eyes: Pupils are equal, round, and reactive to light.  Neck: Normal range of motion.  Respiratory: Effort normal.  Neurological: She is alert.  Skin: Skin is warm.  Psychiatric: She has a normal mood and affect.  Left knee is  examined and she has mild effusion.  Her rotational laxity is slightly more on the left than the right to external rotation stress.  Collaterals feel stable.  Extensor mechanism is intact.  She does have slight valgus alignment.  Pedal pulses intact.  Ankle dorsiflexion plantarflexion intact on the left-hand side.  Assessment/Plan Impression is left knee injury with effusion with somewhat atypical isolated popliteal tendon avulsion off the femur.  Her conjoined tendon attachment site to the fibula as well as LCL attachment site to the fibular show strain but no discrete tear.  This is confirmed on exam.  The only real asymmetry she has on examination is slightly more external rotation on the left compared to the right.  Plan is arthroscopic evaluation and meniscal debridement on that medial side.  Also plan to do open reconstruction of that popliteal tendon.  Risk and benefits are discussed including but limited to infection nerve vessel damage incomplete healing.  I think she will need to have a period of nonweightbearing with that left leg as well.  All questions answered.  Burnard Bunting, MD 01/09/2019, 9:41 AM

## 2019-01-10 ENCOUNTER — Ambulatory Visit (HOSPITAL_COMMUNITY): Payer: BLUE CROSS/BLUE SHIELD | Admitting: Vascular Surgery

## 2019-01-10 ENCOUNTER — Encounter (HOSPITAL_COMMUNITY)
Admission: RE | Disposition: A | Discharge status: Home / Self Care | Payer: Self-pay | Source: Home / Self Care | Attending: Orthopedic Surgery

## 2019-01-10 ENCOUNTER — Other Ambulatory Visit: Payer: Self-pay

## 2019-01-10 ENCOUNTER — Ambulatory Visit (HOSPITAL_COMMUNITY)
Admission: RE | Admit: 2019-01-10 | Discharge: 2019-01-10 | Disposition: A | Discharge status: Home / Self Care | Payer: BLUE CROSS/BLUE SHIELD | Attending: Orthopedic Surgery | Admitting: Orthopedic Surgery

## 2019-01-10 ENCOUNTER — Encounter (HOSPITAL_COMMUNITY): Payer: Self-pay

## 2019-01-10 DIAGNOSIS — F419 Anxiety disorder, unspecified: Secondary | ICD-10-CM | POA: Diagnosis not present

## 2019-01-10 DIAGNOSIS — S83242D Other tear of medial meniscus, current injury, left knee, subsequent encounter: Secondary | ICD-10-CM | POA: Diagnosis not present

## 2019-01-10 DIAGNOSIS — S83242A Other tear of medial meniscus, current injury, left knee, initial encounter: Secondary | ICD-10-CM | POA: Diagnosis not present

## 2019-01-10 DIAGNOSIS — Z888 Allergy status to other drugs, medicaments and biological substances status: Secondary | ICD-10-CM | POA: Diagnosis not present

## 2019-01-10 DIAGNOSIS — F329 Major depressive disorder, single episode, unspecified: Secondary | ICD-10-CM | POA: Diagnosis not present

## 2019-01-10 DIAGNOSIS — M1990 Unspecified osteoarthritis, unspecified site: Secondary | ICD-10-CM | POA: Diagnosis not present

## 2019-01-10 DIAGNOSIS — Z811 Family history of alcohol abuse and dependence: Secondary | ICD-10-CM | POA: Diagnosis not present

## 2019-01-10 DIAGNOSIS — X500XXA Overexertion from strenuous movement or load, initial encounter: Secondary | ICD-10-CM | POA: Insufficient documentation

## 2019-01-10 DIAGNOSIS — S83422D Sprain of lateral collateral ligament of left knee, subsequent encounter: Secondary | ICD-10-CM | POA: Diagnosis not present

## 2019-01-10 DIAGNOSIS — Z820 Family history of epilepsy and other diseases of the nervous system: Secondary | ICD-10-CM | POA: Diagnosis not present

## 2019-01-10 DIAGNOSIS — S86812A Strain of other muscle(s) and tendon(s) at lower leg level, left leg, initial encounter: Secondary | ICD-10-CM | POA: Insufficient documentation

## 2019-01-10 HISTORY — DX: Anemia, unspecified: D64.9

## 2019-01-10 HISTORY — DX: Major depressive disorder, single episode, unspecified: F32.9

## 2019-01-10 HISTORY — DX: Depression, unspecified: F32.A

## 2019-01-10 HISTORY — PX: TENDON REPAIR: SHX5111

## 2019-01-10 HISTORY — DX: Anxiety disorder, unspecified: F41.9

## 2019-01-10 HISTORY — DX: Unspecified asthma, uncomplicated: J45.909

## 2019-01-10 HISTORY — PX: KNEE ARTHROSCOPY: SHX127

## 2019-01-10 LAB — CBC
HCT: 43.7 % (ref 36.0–46.0)
Hemoglobin: 13.3 g/dL (ref 12.0–15.0)
MCH: 22.7 pg — ABNORMAL LOW (ref 26.0–34.0)
MCHC: 30.4 g/dL (ref 30.0–36.0)
MCV: 74.4 fL — ABNORMAL LOW (ref 80.0–100.0)
Platelets: 476 10*3/uL — ABNORMAL HIGH (ref 150–400)
RBC: 5.87 MIL/uL — ABNORMAL HIGH (ref 3.87–5.11)
RDW: 17.3 % — ABNORMAL HIGH (ref 11.5–15.5)
WBC: 8.7 10*3/uL (ref 4.0–10.5)
nRBC: 0 % (ref 0.0–0.2)

## 2019-01-10 LAB — GLUCOSE, CAPILLARY: Glucose-Capillary: 89 mg/dL (ref 70–99)

## 2019-01-10 LAB — BASIC METABOLIC PANEL
Anion gap: 11 (ref 5–15)
BUN: 14 mg/dL (ref 6–20)
CO2: 24 mmol/L (ref 22–32)
Calcium: 9.9 mg/dL (ref 8.9–10.3)
Chloride: 104 mmol/L (ref 98–111)
Creatinine, Ser: 0.84 mg/dL (ref 0.44–1.00)
GFR calc Af Amer: >60 mL/min (ref >60)
GFR calc non Af Amer: >60 mL/min (ref >60)
Glucose, Bld: 96 mg/dL (ref 70–99)
Potassium: 4 mmol/L (ref 3.5–5.1)
Sodium: 139 mmol/L (ref 135–145)

## 2019-01-10 LAB — POCT PREGNANCY, URINE: Preg Test, Ur: NEGATIVE

## 2019-01-10 SURGERY — ARTHROSCOPY, KNEE
Anesthesia: General | Site: Knee | Laterality: Left

## 2019-01-10 MED ORDER — DEXTROSE 5 % IV SOLN
3.0000 g | INTRAVENOUS | Status: AC
  Administered 2019-01-10: 3 g via INTRAVENOUS
  Filled 2019-01-10: qty 3

## 2019-01-10 MED ORDER — PHENYLEPHRINE 40 MCG/ML (10ML) SYRINGE FOR IV PUSH (FOR BLOOD PRESSURE SUPPORT)
PREFILLED_SYRINGE | INTRAVENOUS | Status: AC
  Filled 2019-01-10: qty 10

## 2019-01-10 MED ORDER — BUPIVACAINE HCL (PF) 0.25 % IJ SOLN
INTRAMUSCULAR | Status: DC | PRN
  Administered 2019-01-10: 30 mL

## 2019-01-10 MED ORDER — BUPIVACAINE-EPINEPHRINE (PF) 0.5% -1:200000 IJ SOLN
INTRAMUSCULAR | Status: DC | PRN
  Administered 2019-01-10: 10 mL

## 2019-01-10 MED ORDER — 0.9 % SODIUM CHLORIDE (POUR BTL) OPTIME
TOPICAL | Status: DC | PRN
  Administered 2019-01-10: 12:00:00 1000 mL

## 2019-01-10 MED ORDER — ONDANSETRON HCL 4 MG/2ML IJ SOLN
INTRAMUSCULAR | Status: DC | PRN
  Administered 2019-01-10: 4 mg via INTRAVENOUS

## 2019-01-10 MED ORDER — BUPIVACAINE-EPINEPHRINE (PF) 0.25% -1:200000 IJ SOLN
INTRAMUSCULAR | Status: AC
  Filled 2019-01-10: qty 30

## 2019-01-10 MED ORDER — ESMOLOL HCL 100 MG/10ML IV SOLN
INTRAVENOUS | Status: AC
  Filled 2019-01-10: qty 10

## 2019-01-10 MED ORDER — LIDOCAINE 2% (20 MG/ML) 5 ML SYRINGE
INTRAMUSCULAR | Status: DC | PRN
  Administered 2019-01-10: 60 mg via INTRAVENOUS

## 2019-01-10 MED ORDER — PROPOFOL 10 MG/ML IV BOLUS
INTRAVENOUS | Status: AC
  Filled 2019-01-10: qty 20

## 2019-01-10 MED ORDER — OXYCODONE HCL 5 MG PO TABS
ORAL_TABLET | ORAL | Status: AC
  Filled 2019-01-10: qty 1

## 2019-01-10 MED ORDER — OXYCODONE HCL 5 MG/5ML PO SOLN: 5.0000 mg | Freq: Once | ORAL | Status: AC | PRN

## 2019-01-10 MED ORDER — SODIUM CHLORIDE 0.9 % IR SOLN
Status: DC | PRN
  Administered 2019-01-10 (×3): 3000 mL

## 2019-01-10 MED ORDER — DEXAMETHASONE SODIUM PHOSPHATE 10 MG/ML IJ SOLN
INTRAMUSCULAR | Status: AC
  Filled 2019-01-10: qty 1

## 2019-01-10 MED ORDER — FENTANYL CITRATE (PF) 100 MCG/2ML IJ SOLN
INTRAMUSCULAR | Status: AC
  Administered 2019-01-10: 50 ug via INTRAVENOUS
  Filled 2019-01-10: qty 2

## 2019-01-10 MED ORDER — CLONIDINE HCL (ANALGESIA) 100 MCG/ML EP SOLN
EPIDURAL | Status: AC
  Filled 2019-01-10: qty 10

## 2019-01-10 MED ORDER — CHLORHEXIDINE GLUCONATE 4 % EX LIQD: 60.0000 mL | Freq: Once | CUTANEOUS | Status: DC

## 2019-01-10 MED ORDER — FENTANYL CITRATE (PF) 250 MCG/5ML IJ SOLN
INTRAMUSCULAR | Status: DC | PRN
  Administered 2019-01-10: 100 ug via INTRAVENOUS
  Administered 2019-01-10 (×3): 50 ug via INTRAVENOUS
  Administered 2019-01-10: 100 ug via INTRAVENOUS
  Administered 2019-01-10: 50 ug via INTRAVENOUS

## 2019-01-10 MED ORDER — EPHEDRINE 5 MG/ML INJ
INTRAVENOUS | Status: AC
  Filled 2019-01-10: qty 10

## 2019-01-10 MED ORDER — ONDANSETRON HCL 4 MG/2ML IJ SOLN
INTRAMUSCULAR | Status: AC
  Filled 2019-01-10: qty 2

## 2019-01-10 MED ORDER — PROPOFOL 10 MG/ML IV BOLUS
INTRAVENOUS | Status: DC | PRN
  Administered 2019-01-10: 40 mg via INTRAVENOUS
  Administered 2019-01-10: 200 mg via INTRAVENOUS
  Administered 2019-01-10: 10 mg via INTRAVENOUS
  Administered 2019-01-10: 20 mg via INTRAVENOUS

## 2019-01-10 MED ORDER — SUCCINYLCHOLINE CHLORIDE 200 MG/10ML IV SOSY
PREFILLED_SYRINGE | INTRAVENOUS | Status: AC
  Filled 2019-01-10: qty 10

## 2019-01-10 MED ORDER — SUCCINYLCHOLINE CHLORIDE 20 MG/ML IJ SOLN
INTRAMUSCULAR | Status: DC | PRN
  Administered 2019-01-10: 160 mg via INTRAVENOUS

## 2019-01-10 MED ORDER — FENTANYL CITRATE (PF) 250 MCG/5ML IJ SOLN
INTRAMUSCULAR | Status: AC
  Filled 2019-01-10: qty 5

## 2019-01-10 MED ORDER — DEXAMETHASONE SODIUM PHOSPHATE 10 MG/ML IJ SOLN
INTRAMUSCULAR | Status: DC | PRN
  Administered 2019-01-10: 10 mg via INTRAVENOUS

## 2019-01-10 MED ORDER — OXYCODONE HCL 5 MG PO TABS
5.0000 mg | ORAL_TABLET | Freq: Once | ORAL | Status: AC | PRN
  Administered 2019-01-10: 5 mg via ORAL

## 2019-01-10 MED ORDER — ONDANSETRON HCL 4 MG/2ML IJ SOLN: 4.0000 mg | Freq: Once | INTRAMUSCULAR | Status: DC | PRN

## 2019-01-10 MED ORDER — MIDAZOLAM HCL 2 MG/2ML IJ SOLN
INTRAMUSCULAR | Status: AC
  Filled 2019-01-10: qty 2

## 2019-01-10 MED ORDER — BUPIVACAINE HCL (PF) 0.25 % IJ SOLN
INTRAMUSCULAR | Status: AC
  Filled 2019-01-10: qty 30

## 2019-01-10 MED ORDER — EPINEPHRINE PF 1 MG/ML IJ SOLN
INTRAMUSCULAR | Status: AC
  Filled 2019-01-10: qty 2

## 2019-01-10 MED ORDER — CLONIDINE HCL (ANALGESIA) 100 MCG/ML EP SOLN
EPIDURAL | Status: DC | PRN
  Administered 2019-01-10: 1 mL

## 2019-01-10 MED ORDER — LACTATED RINGERS IV SOLN
INTRAVENOUS | Status: DC
  Administered 2019-01-10 (×2): via INTRAVENOUS

## 2019-01-10 MED ORDER — MIDAZOLAM HCL 5 MG/5ML IJ SOLN
INTRAMUSCULAR | Status: DC | PRN
  Administered 2019-01-10: 2 mg via INTRAVENOUS

## 2019-01-10 MED ORDER — FENTANYL CITRATE (PF) 100 MCG/2ML IJ SOLN
25.0000 ug | INTRAMUSCULAR | Status: DC | PRN
  Administered 2019-01-10 (×2): 50 ug via INTRAVENOUS

## 2019-01-10 MED ORDER — ESMOLOL HCL 100 MG/10ML IV SOLN
INTRAVENOUS | Status: DC | PRN
  Administered 2019-01-10 (×2): 10 mg via INTRAVENOUS

## 2019-01-10 MED ORDER — ROCURONIUM BROMIDE 50 MG/5ML IV SOSY
PREFILLED_SYRINGE | INTRAVENOUS | Status: AC
  Filled 2019-01-10: qty 5

## 2019-01-10 MED ORDER — MORPHINE SULFATE (PF) 4 MG/ML IV SOLN
INTRAVENOUS | Status: AC
  Filled 2019-01-10: qty 2

## 2019-01-10 MED ORDER — LIDOCAINE 2% (20 MG/ML) 5 ML SYRINGE
INTRAMUSCULAR | Status: AC
  Filled 2019-01-10: qty 5

## 2019-01-10 MED ORDER — MORPHINE SULFATE (PF) 4 MG/ML IV SOLN
INTRAVENOUS | Status: DC | PRN
  Administered 2019-01-10: 8 mg via INTRAVENOUS

## 2019-01-10 SURGICAL SUPPLY — 82 items
ANCHOR SUT BIO SW 4.75X19.1 (Anchor) ×4 IMPLANT
BANDAGE ACE 4X5 VEL STRL LF (GAUZE/BANDAGES/DRESSINGS) ×4 IMPLANT
BANDAGE ESMARK 6X9 LF (GAUZE/BANDAGES/DRESSINGS) IMPLANT
BLADE 10 SAFETY STRL DISP (BLADE) ×3 IMPLANT
BLADE CLIPPER SURG (BLADE) IMPLANT
BLADE CUTTER GATOR 3.5 (BLADE) ×4 IMPLANT
BLADE EXCALIBUR 4.0MM X 13CM (MISCELLANEOUS) ×1
BLADE EXCALIBUR 4.0X13 (MISCELLANEOUS) ×3 IMPLANT
BLADE GREAT WHITE 4.2 (BLADE) IMPLANT
BLADE GREAT WHITE 4.2MM (BLADE)
BLADE SHAVER TORPEDO 4X13 (MISCELLANEOUS) ×4 IMPLANT
BLADE SURG 10 STRL SS (BLADE) ×4 IMPLANT
BNDG ELASTIC 6X10 VLCR STRL LF (GAUZE/BANDAGES/DRESSINGS) IMPLANT
BNDG ELASTIC 6X15 VLCR STRL LF (GAUZE/BANDAGES/DRESSINGS) ×4 IMPLANT
BNDG ESMARK 6X9 LF (GAUZE/BANDAGES/DRESSINGS)
BUR OVAL 6.0 (BURR) IMPLANT
CLOSURE STERI-STRIP 1/2X4 (GAUZE/BANDAGES/DRESSINGS) ×1
CLSR STERI-STRIP ANTIMIC 1/2X4 (GAUZE/BANDAGES/DRESSINGS) ×3 IMPLANT
COVER SURGICAL LIGHT HANDLE (MISCELLANEOUS) ×4 IMPLANT
COVER WAND RF STERILE (DRAPES) ×4 IMPLANT
CUFF TOURNIQUET SINGLE 44IN (TOURNIQUET CUFF) ×12 IMPLANT
DECANTER SPIKE VIAL GLASS SM (MISCELLANEOUS) ×4 IMPLANT
DRAPE ARTHROSCOPY W/POUCH 114 (DRAPES) ×4 IMPLANT
DRAPE HALF SHEET 40X57 (DRAPES) IMPLANT
DRAPE U-SHAPE 47X51 STRL (DRAPES) ×4 IMPLANT
DRSG TEGADERM 4X4.75 (GAUZE/BANDAGES/DRESSINGS) ×4 IMPLANT
DURAPREP 26ML APPLICATOR (WOUND CARE) ×4 IMPLANT
DW OUTFLOW CASSETTE/TUBE SET (MISCELLANEOUS) ×4 IMPLANT
ELECT PENCIL ROCKER SW 15FT (MISCELLANEOUS) ×4 IMPLANT
ELECT REM PT RETURN 9FT ADLT (ELECTROSURGICAL) ×4
ELECTRODE REM PT RTRN 9FT ADLT (ELECTROSURGICAL) ×2 IMPLANT
GAUZE SPONGE 4X4 12PLY STRL (GAUZE/BANDAGES/DRESSINGS) IMPLANT
GAUZE SPONGE 4X4 12PLY STRL LF (GAUZE/BANDAGES/DRESSINGS) ×4 IMPLANT
GAUZE XEROFORM 1X8 LF (GAUZE/BANDAGES/DRESSINGS) IMPLANT
GLOVE BIOGEL PI IND STRL 6.5 (GLOVE) ×4 IMPLANT
GLOVE BIOGEL PI IND STRL 8 (GLOVE) ×4 IMPLANT
GLOVE BIOGEL PI INDICATOR 6.5 (GLOVE) ×4
GLOVE BIOGEL PI INDICATOR 8 (GLOVE) ×4
GLOVE SURG ORTHO 8.0 STRL STRW (GLOVE) ×8 IMPLANT
GLOVE SURG SS PI 6.5 STRL IVOR (GLOVE) ×12 IMPLANT
GOWN STRL REUS W/ TWL LRG LVL3 (GOWN DISPOSABLE) ×4 IMPLANT
GOWN STRL REUS W/ TWL XL LVL3 (GOWN DISPOSABLE) ×2 IMPLANT
GOWN STRL REUS W/TWL LRG LVL3 (GOWN DISPOSABLE) ×4
GOWN STRL REUS W/TWL XL LVL3 (GOWN DISPOSABLE) ×2
IMMOBILIZER KNEE 22 UNIV (SOFTGOODS) ×4 IMPLANT
KIT BASIN OR (CUSTOM PROCEDURE TRAY) ×4 IMPLANT
KIT TURNOVER KIT B (KITS) ×4 IMPLANT
MANIFOLD NEPTUNE II (INSTRUMENTS) IMPLANT
NEEDLE 18GX1X1/2 (RX/OR ONLY) (NEEDLE) IMPLANT
NEEDLE HYPO 25GX1X1/2 BEV (NEEDLE) ×4 IMPLANT
NS IRRIG 1000ML POUR BTL (IV SOLUTION) IMPLANT
PACK ARTHROSCOPY DSU (CUSTOM PROCEDURE TRAY) ×4 IMPLANT
PAD ABD 7.5X8 STRL (GAUZE/BANDAGES/DRESSINGS) ×4 IMPLANT
PAD ARMBOARD 7.5X6 YLW CONV (MISCELLANEOUS) ×8 IMPLANT
PAD CAST 4YDX4 CTTN HI CHSV (CAST SUPPLIES) ×2 IMPLANT
PADDING CAST COTTON 4X4 STRL (CAST SUPPLIES) ×2
PADDING CAST COTTON 6X4 STRL (CAST SUPPLIES) ×4 IMPLANT
SET ARTHROSCOPY TUBING (MISCELLANEOUS) ×2
SET ARTHROSCOPY TUBING LN (MISCELLANEOUS) ×2 IMPLANT
SPONGE LAP 4X18 RFD (DISPOSABLE) ×4 IMPLANT
SUCTION FRAZIER TIP 10 FR DISP (SUCTIONS) ×4 IMPLANT
SUT ETHILON 3 0 PS 1 (SUTURE) ×4 IMPLANT
SUT MENISCAL KIT (KITS) IMPLANT
SUT MNCRL AB 3-0 PS2 18 (SUTURE) ×4 IMPLANT
SUT VIC AB 0 CT1 27 (SUTURE) ×4
SUT VIC AB 0 CT1 27XBRD ANBCTR (SUTURE) ×4 IMPLANT
SUT VIC AB 1 CT1 27 (SUTURE) ×4
SUT VIC AB 1 CT1 27XBRD ANBCTR (SUTURE) ×4 IMPLANT
SUT VIC AB 2-0 CT1 27 (SUTURE) ×2
SUT VIC AB 2-0 CT1 TAPERPNT 27 (SUTURE) ×2 IMPLANT
SYR 20ML ECCENTRIC (SYRINGE) ×4 IMPLANT
SYR CONTROL 10ML LL (SYRINGE) IMPLANT
SYR TB 1ML LUER SLIP (SYRINGE) ×4 IMPLANT
SYRINGE 3CC LL L/F (MISCELLANEOUS) ×4 IMPLANT
TOWEL OR 17X24 6PK STRL BLUE (TOWEL DISPOSABLE) ×4 IMPLANT
TOWEL OR 17X26 10 PK STRL BLUE (TOWEL DISPOSABLE) ×4 IMPLANT
TUBE CONNECTING 12'X1/4 (SUCTIONS) ×1
TUBE CONNECTING 12X1/4 (SUCTIONS) ×3 IMPLANT
TUBING ARTHROSCOPY IRRIG 16FT (MISCELLANEOUS) ×4 IMPLANT
WAND HAND CNTRL MULTIVAC 90 (MISCELLANEOUS) IMPLANT
WATER STERILE IRR 1000ML POUR (IV SOLUTION) ×4 IMPLANT
YANKAUER SUCT BULB TIP NO VENT (SUCTIONS) ×4 IMPLANT

## 2019-01-10 NOTE — Interval H&P Note (Signed)
History and Physical Interval Note:  01/10/2019 10:35 AM  Laura Cowan  has presented today for surgery, with the diagnosis of LEFT KNEE TENDON AVULSION, MENISCAL TEAR.  The various methods of treatment have been discussed with the patient and family. After consideration of risks, benefits and other options for treatment, the patient has consented to  Procedure(s): LEFT KNEE ARTHROSCOPY KNEE, MENISCAL DEBRIDEMENT (Left) as a surgical intervention.  The patient's history has been reviewed, patient examined, no change in status, stable for surgery.  I have reviewed the patient's chart and labs.  Questions were answered to the patient's satisfaction.     Burnard Bunting

## 2019-01-10 NOTE — Anesthesia Preprocedure Evaluation (Signed)
Anesthesia Evaluation  Patient identified by MRN, date of birth, ID band Patient awake    Reviewed: Allergy & Precautions, NPO status , Patient's Chart, lab work & pertinent test results  Airway Mallampati: II  TM Distance: >3 FB Neck ROM: Full    Dental  (+) Teeth Intact, Dental Advisory Given   Pulmonary    breath sounds clear to auscultation       Cardiovascular  Rhythm:Regular Rate:Normal     Neuro/Psych    GI/Hepatic   Endo/Other    Renal/GU      Musculoskeletal   Abdominal (+) + obese,   Peds  Hematology   Anesthesia Other Findings   Reproductive/Obstetrics                             Anesthesia Physical Anesthesia Plan  ASA: II  Anesthesia Plan: General   Post-op Pain Management:    Induction: Intravenous  PONV Risk Score and Plan: Ondansetron and Dexamethasone  Airway Management Planned: LMA  Additional Equipment:   Intra-op Plan:   Post-operative Plan:   Informed Consent: I have reviewed the patients History and Physical, chart, labs and discussed the procedure including the risks, benefits and alternatives for the proposed anesthesia with the patient or authorized representative who has indicated his/her understanding and acceptance.     Dental advisory given  Plan Discussed with: CRNA and Anesthesiologist  Anesthesia Plan Comments:         Anesthesia Quick Evaluation  

## 2019-01-10 NOTE — Brief Op Note (Signed)
01/10/2019  2:22 PM  PATIENT:  Laura Cowan  23 y.o. female  PRE-OPERATIVE DIAGNOSIS:  LEFT KNEE TENDON AVULSION, MENISCAL TEAR  POST-OPERATIVE DIAGNOSIS:  LEFT KNEE TENDON AVULSION, MENISCAL TEAR  PROCEDURE:  Procedure(s): LEFT KNEE ARTHROSCOPY KNEE, MENISCAL DEBRIDEMENT Tendon Repair  SURGEON:  Surgeon(s): Cammy Copa, MD  ASSISTANT: Kriste Basque pa  ANESTHESIA:   general  EBL: 50 ml    Total I/O In: 1000 [I.V.:1000] Out: 100 [Blood:100]  BLOOD ADMINISTERED: none  DRAINS: none   LOCAL MEDICATIONS USED:  Marcaine mso4 clonidine  SPECIMEN:  No Specimen  COUNTS:  YES  TOURNIQUET:  * Missing tourniquet times found for documented tourniquets in log: [id number] *  DICTATION: .Other Dictation: Dictation Number [id number]  PLAN OF CARE: Discharge to home after PACU  PATIENT DISPOSITION:  PACU - hemodynamically stable

## 2019-01-10 NOTE — Anesthesia Procedure Notes (Signed)
Procedure Name: Intubation Date/Time: 01/10/2019 11:18 AM Performed by: Hart Robinsons, CRNA Pre-anesthesia Checklist: Patient identified, Emergency Drugs available, Suction available and Patient being monitored Patient Re-evaluated:Patient Re-evaluated prior to induction Oxygen Delivery Method: Circle System Utilized Preoxygenation: Pre-oxygenation with 100% oxygen Induction Type: IV induction and Rapid sequence Laryngoscope Size: Miller and 2 Grade View: Grade I Tube type: Oral Tube size: 7.0 mm Number of attempts: 1 Airway Equipment and Method: Stylet and Oral airway Placement Confirmation: ETT inserted through vocal cords under direct vision,  positive ETCO2 and breath sounds checked- equal and bilateral Secured at: 21 cm Tube secured with: Tape Dental Injury: Teeth and Oropharynx as per pre-operative assessment  Comments: No change in dentition from pre-procedure

## 2019-01-10 NOTE — Op Note (Signed)
NAME: Laura Cowan, Laura Cowan MEDICAL RECORD YS:[id number] ACCOUNT [id number] DATE OF BIRTH:Jun 26, 1996 FACILITY: [facility] LOCATION: [facility] PHYSICIAN:GREGORY Diamantina [place], MD  OPERATIVE REPORT  DATE OF PROCEDURE:  01/10/2019  PREOPERATIVE DIAGNOSES:  Left knee medial meniscal tear and popliteal tendon avulsion.  POSTOPERATIVE DIAGNOSES:  Left knee medial meniscal tear and popliteal tendon avulsion.  PROCEDURE:  Left knee arthroscopy with partial medial meniscectomy and open popliteal tendon repair to the femur.  SURGEON:  Cammy Copa, MD  ASSISTANT:  Rexene Edison, PA-C   INDICATIONS:  The patient is a 23 year old female who injured her left knee.  She presents now for operative management after explanation of risks and benefits for meniscal pathology as well as popliteal avulsion.  EXAMINATION UNDER ANESTHESIA:  Range of motion:  The patient had about 10 degrees of hyperextension of both knees with full flexion.  The patient did have an effusion in the left knee.  No effusion right knee.  Collateral and cruciate ligaments were  stable.  She did have about 5-10 degrees more external rotation on the left compared to the right, but this was a subtle finding.  The patient did have good stability to varus stress at both 0 and 30 degrees.  OPERATIVE FINDINGS AND DIAGNOSTIC ARTHROSCOPY: 1.  Intact patellofemoral compartment. 2.  Intact medial compartment articular cartilage with a radial tear of that medial meniscus involving about 30% anterior, posterior width of the meniscus. 3.  Intact ACL and PCL. 4.  Intact lateral meniscus and articular cartilage with some evidence of laxity, edema, and erythema with edema and induration within the popliteus tendon.  PROCEDURE IN DETAIL:  The patient was brought to the operating room where general anesthetic was induced.  Preoperative antibiotics were administered.  Timeout was called.  Left leg prescrubbed with alcohol and Betadine, allowed to air dry.  Prepped  with  DuraPrep solution and draped in a sterile manner.  Ioban used to cover the operative field.  The anterior inferolateral portal was established after numbing with the Marcaine with epinephrine.  Anterior inferomedial portal was then established after  numbing with Marcaine.  Diagnostic arthroscopy demonstrated a medial meniscal tear which was a radial-type tear which was debrided back to a stable rim using a combination of basket punch and shaver.  ACL, PCL intact.  In general, the articular cartilage  had mild grade I changes, but in general looked reasonable.  Patellofemoral compartment was intact.  The lateral compartment was inspected, and the popliteus tendon was found to have a slight amount of laxity as well as some edema and evidence of injury  within the intraarticular portion.  At this time following arthroscopy, the knee joint was thoroughly irrigated.  Instruments were removed.  Portals were closed using 3-0 nylon.  A lateral incision was made over the iliotibial band extending distally  towards the anterior inferolateral portal.  Skin and subcutaneous tissue were sharply divided.  The iliotibial band was then divided down to the anterior aspect of the joint.  The attempt was made to localize the entire popliteus tendon, but within the  popliteus hiatus, the tendon had avulsed and was healed about 1 cm to 1.5 cm halfway.  This area of tissue, which was about one-third of the popliteus tendon attachment in size, was mobilized and then reattached at the anterior superior aspect of the  popliteal sulcus using an Arthrex SwiveLock.  At this time, thorough irrigation was performed, and the lateral arthrotomy was closed using #1 Vicryl suture followed by interrupted inverted 0 Vicryl  suture for the iliotibial band and then 0 Vicryl suture,  2-0 Vicryl suture, and a 3-0 Monocryl for the incision.  Waterproof dressings, an Ace wrap, and knee immobilizer were placed.  The patient tolerated the  procedure well without immediate complications and was transferred to the recovery room in stable  condition.  It should be noted that Marcaine, morphine, and clonidine was injected into the knee and the incisions for postop pain relief.  LN/NUANCE  D:01/10/2019 T:01/10/2019 JOB:[id number]

## 2019-01-10 NOTE — Interval H&P Note (Signed)
History and Physical Interval Note:  01/10/2019 10:35 AM  Laura Cowan  has presented today for surgery, with the diagnosis of LEFT KNEE TENDON AVULSION, MENISCAL TEAR.  The various methods of treatment have been discussed with the patient and family. After consideration of risks, benefits and other options for treatment, the patient has consented to  Procedure(s): LEFT KNEE ARTHROSCOPY KNEE, MENISCAL DEBRIDEMENT (Left) with open tendon reattachment as a surgical intervention.  The patient's history has been reviewed, patient examined, no change in status, stable for surgery.  I have reviewed the patient's chart and labs.  Questions were answered to the patient's satisfaction.     Burnard Bunting

## 2019-01-10 NOTE — Transfer of Care (Signed)
Immediate Anesthesia Transfer of Care Note  Patient: Laura Cowan  Procedure(s) Performed: LEFT KNEE ARTHROSCOPY KNEE, MENISCAL DEBRIDEMENT (Left ) Tendon Repair (Left Knee)  Patient Location: PACU  Anesthesia Type:General  Level of Consciousness: awake, alert  and oriented  Airway & Oxygen Therapy: Patient Spontanous Breathing and Patient connected to face mask oxygen  Post-op Assessment: Report given to RN and Post -op Vital signs reviewed and stable  Post vital signs: Reviewed and stable  Last Vitals:  Vitals Value Taken Time  BP 128/69 01/10/2019  2:26 PM  Temp 36.2 C 01/10/2019  2:26 PM  Pulse 116 01/10/2019  2:27 PM  Resp 14 01/10/2019  2:27 PM  SpO2 100 % 01/10/2019  2:27 PM  Vitals shown include unvalidated device data.  Last Pain:  Vitals:   01/10/19 0856  TempSrc: Oral  PainSc: 0-No pain      Patients Stated Pain Goal: 3 (01/10/19 0856)  Complications: No apparent anesthesia complications

## 2019-01-11 ENCOUNTER — Encounter (HOSPITAL_COMMUNITY): Payer: Self-pay | Admitting: Orthopedic Surgery

## 2019-01-11 NOTE — Anesthesia Postprocedure Evaluation (Signed)
Anesthesia Post Note  Patient: Laura Cowan  Procedure(s) Performed: LEFT KNEE ARTHROSCOPY KNEE, MENISCAL DEBRIDEMENT (Left ) Tendon Repair (Left Knee)     Patient location during evaluation: PACU Anesthesia Type: General Level of consciousness: awake and alert Pain management: pain level controlled Vital Signs Assessment: post-procedure vital signs reviewed and stable Respiratory status: spontaneous breathing, nonlabored ventilation, respiratory function stable and patient connected to nasal cannula oxygen Cardiovascular status: blood pressure returned to baseline and stable Postop Assessment: no apparent nausea or vomiting Anesthetic complications: no    Last Vitals:  Vitals:   01/10/19 1455 01/10/19 1515  BP: 113/70 133/67  Pulse: (!) 115 (!) 111  Resp: 18 17  Temp: (!) 36.2 C   SpO2: 95% 94%    Last Pain:  Vitals:   01/10/19 1514  TempSrc:   PainSc: 4                  Ardian Haberland COKER

## 2019-01-12 ENCOUNTER — Telehealth (INDEPENDENT_AMBULATORY_CARE_PROVIDER_SITE_OTHER): Payer: Self-pay | Admitting: Orthopedic Surgery

## 2019-01-12 MED ORDER — TRAMADOL HCL 50 MG PO TABS: ORAL_TABLET | ORAL | 0 refills | Status: DC

## 2019-01-12 NOTE — Telephone Encounter (Signed)
Please advise. Thanks.  

## 2019-01-12 NOTE — Telephone Encounter (Signed)
I would add tramadol to her regimen.  1 p.o. every 8 hours for breakthrough pain.  We will see if that helps also.  Number 30 tablets.  Thanks

## 2019-01-12 NOTE — Telephone Encounter (Signed)
pts mother, Laura Cowan called, patient had surgery on Tuesday and is needing something for breakthrough pain. Please call her or the patient back. Her number is [phone]. And  pts # [phone]

## 2019-01-12 NOTE — Telephone Encounter (Signed)
Called to pharmacy 

## 2019-01-18 ENCOUNTER — Other Ambulatory Visit: Payer: Self-pay

## 2019-01-18 ENCOUNTER — Ambulatory Visit (INDEPENDENT_AMBULATORY_CARE_PROVIDER_SITE_OTHER): Payer: BLUE CROSS/BLUE SHIELD | Admitting: Orthopedic Surgery

## 2019-01-18 ENCOUNTER — Encounter: Payer: Self-pay | Admitting: Orthopedic Surgery

## 2019-01-18 DIAGNOSIS — S838X2D Sprain of other specified parts of left knee, subsequent encounter: Secondary | ICD-10-CM

## 2019-01-18 MED ORDER — OXYCODONE HCL 5 MG PO TABS: ORAL_TABLET | ORAL | 0 refills | Status: DC

## 2019-01-18 NOTE — Progress Notes (Signed)
   Post-Op Visit Note   Patient: Laura Cowan           Date of Birth: 1995/12/08           MRN: [id number] Visit Date: 01/18/2019 PCP: Patient, No Pcp Per   Assessment & Plan:  Chief Complaint:  Chief Complaint  Patient presents with  . Left Knee - Routine Post Op   Visit Diagnoses:  1. Injury of meniscus of left knee, subsequent encounter     Plan: Laura Cowan is a patient is now about a week out left knee arthroscopy with open popliteus tendon repair.  She has been doing well.  A little bit of sharp pain in the back of her knee this morning only.  On exam she has good stability and minimal effusion.  She is at 60 on the CPM machine taking aspirin and oxycodone.  Negative Homans today and no real calf swelling.  I am going to refill her oxycodone and have her weightbearing with crutches in the knee immobilizer but DC the knee immobilizer when she is out of ambulating.  Okay for knee range of motion as tolerated when she is not weightbearing.  Follow-up in a week just I can recheck on the range of motion.  If she still is having that sharp pain in the back of her knee tomorrow have asked her to call me and we will get her set up for ultrasound.  Follow-Up Instructions: Return in about 1 week (around 01/25/2019).   Orders:  No orders of the defined types were placed in this encounter.  Meds ordered this encounter  Medications  . oxyCODONE (ROXICODONE) 5 MG immediate release tablet    Sig: 1 po bid prn    Dispense:  25 tablet    Refill:  0    Imaging: No results found.  PMFS History: Patient Active Problem List   Diagnosis Date Noted  . Major depressive disorder, recurrent episode, moderate (HCC) 11/21/2018  . GAD (generalized anxiety disorder) 11/21/2018  . Panic disorder 11/21/2018   Past Medical History:  Diagnosis Date  . Anemia    while on BCP  . Anxiety   . Arthritis   . Asthma    as a toddler  . Depression   . 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 Alcohol abuse Maternal Uncle   . Dementia Maternal Grandmother     Past Surgical History:  Procedure Laterality Date  . KNEE ARTHROSCOPY Left 01/10/2019   Procedure: LEFT KNEE ARTHROSCOPY KNEE, MENISCAL DEBRIDEMENT;  Surgeon: Cammy Copa, MD;  Location: [facility] OR;  Service: Orthopedics;  Laterality: Left;  . TENDON REPAIR Left 01/10/2019   Procedure: Tendon Repair;  Surgeon: Cammy Copa, MD;  Location: [facility] [place] OR;  Service: Orthopedics;  Laterality: Left;  . TONSILLECTOMY     and adenoids removed  . WISDOM TOOTH EXTRACTION     Social History   Occupational History  . Not on file  Tobacco Use  . Smoking status: Never Smoker  . Smokeless tobacco: Never Used  Substance and Sexual Activity  . Alcohol use: Yes    Comment: occasional  . Drug use: Yes    Types: Marijuana    Comment: Last use 3-4 months prior  . Sexual activity: Not Currently

## 2019-01-23 ENCOUNTER — Encounter (HOSPITAL_COMMUNITY): Payer: Self-pay | Admitting: Orthopedic Surgery

## 2019-01-25 ENCOUNTER — Ambulatory Visit (INDEPENDENT_AMBULATORY_CARE_PROVIDER_SITE_OTHER): Payer: BLUE CROSS/BLUE SHIELD | Admitting: Orthopedic Surgery

## 2019-01-25 ENCOUNTER — Encounter: Payer: Self-pay | Admitting: Orthopedic Surgery

## 2019-01-25 ENCOUNTER — Other Ambulatory Visit: Payer: Self-pay

## 2019-01-25 DIAGNOSIS — S838X2D Sprain of other specified parts of left knee, subsequent encounter: Secondary | ICD-10-CM

## 2019-01-25 MED ORDER — METHOCARBAMOL 500 MG PO TABS: ORAL_TABLET | ORAL | 0 refills | Status: DC

## 2019-01-25 MED ORDER — TRAMADOL HCL 50 MG PO TABS: 50.0000 mg | ORAL_TABLET | Freq: Three times a day (TID) | ORAL | 0 refills | Status: DC | PRN

## 2019-01-25 NOTE — Progress Notes (Signed)
   Post-Op Visit Note   Patient: Laura Cowan           Date of Birth: 1996-08-27           MRN: [id number] Visit Date: 01/25/2019 PCP: Patient, No Pcp Per   Assessment & Plan:  Chief Complaint:  Chief Complaint  Patient presents with  . Left Knee - Follow-up   Visit Diagnoses:  1. Injury of meniscus of left knee, subsequent encounter     Plan: Patient presents for follow-up of left knee meniscal debridement in popliteal tendon repair.  She is a 90 degrees on CPM.  On exam she has excellent improved range of motion to 90.  No calf tenderness.  I will start her in physical therapy and continue with CPM to 110 degrees.  Also add Robaxin and continue that on a daily basis and add physical therapy.  Plan is to have her switch out of the knee immobilizer into a hinged knee brace and continue with partial weightbearing.  Anticipate full weightbearing after 2 more weeks.  Follow-Up Instructions: Return in about 2 weeks (around 02/08/2019).   Orders:  Orders Placed This Encounter  Procedures  . Ambulatory referral to Physical Therapy   Meds ordered this encounter  Medications  . traMADol (ULTRAM) 50 MG tablet    Sig: Take 1 tablet (50 mg total) by mouth every 8 (eight) hours as needed.    Dispense:  45 tablet    Refill:  0  . methocarbamol (ROBAXIN) 500 MG tablet    Sig: 1 po q d prn    Dispense:  30 tablet    Refill:  0    Imaging: No results found.  PMFS History: Patient Active Problem List   Diagnosis Date Noted  . Major depressive disorder, recurrent episode, moderate (HCC) 11/21/2018  . GAD (generalized anxiety disorder) 11/21/2018  . Panic disorder 11/21/2018   Past Medical History:  Diagnosis Date  . Anemia    while on BCP  . Anxiety   . Arthritis   . Asthma    as a toddler  . Depression   . 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 Alcohol abuse Maternal Uncle   . Dementia Maternal Grandmother     Past Surgical  History:  Procedure Laterality Date  . KNEE ARTHROSCOPY Left 01/10/2019   Procedure: LEFT KNEE ARTHROSCOPY KNEE, MENISCAL DEBRIDEMENT;  Surgeon: Cammy Copa, MD;  Location: [facility] OR;  Service: Orthopedics;  Laterality: Left;  . TENDON REPAIR Left 01/10/2019   Procedure: Tendon Repair;  Surgeon: Cammy Copa, MD;  Location: [facility] OR;  Service: Orthopedics;  Laterality: Left;  . TONSILLECTOMY     and adenoids removed  . WISDOM TOOTH EXTRACTION     Social History   Occupational History  . Not on file  Tobacco Use  . Smoking status: Never Smoker  . Smokeless tobacco: Never Used  Substance and Sexual Activity  . Alcohol use: Yes    Comment: occasional  . Drug use: Yes    Types: Marijuana    Comment: Last use 3-4 months prior  . Sexual activity: Not Currently

## 2019-02-08 ENCOUNTER — Ambulatory Visit: Payer: BLUE CROSS/BLUE SHIELD | Admitting: Orthopedic Surgery

## 2019-02-11 ENCOUNTER — Telehealth: Payer: Self-pay

## 2019-02-11 NOTE — Telephone Encounter (Signed)
Telephone call to Patient to offer coid-19 testing. Left message for return call to [phone].

## 2019-02-12 NOTE — Telephone Encounter (Signed)
Patient was a no show on 02/08/19 at [facility], no exposure to COVID-19, no testing required.

## 2019-02-20 ENCOUNTER — Ambulatory Visit (INDEPENDENT_AMBULATORY_CARE_PROVIDER_SITE_OTHER): Payer: BC Managed Care – PPO | Admitting: Orthopedic Surgery

## 2019-02-20 ENCOUNTER — Encounter: Payer: Self-pay | Admitting: Orthopedic Surgery

## 2019-02-20 ENCOUNTER — Other Ambulatory Visit: Payer: Self-pay

## 2019-02-20 DIAGNOSIS — S838X2A Sprain of other specified parts of left knee, initial encounter: Secondary | ICD-10-CM

## 2019-02-20 NOTE — Progress Notes (Signed)
   Post-Op Visit Note   Patient: Laura Cowan           Date of Birth: 1996/02/28           MRN: [id number] Visit Date: 02/20/2019 PCP: Patient, No Pcp Per   Assessment & Plan:  Chief Complaint:  Chief Complaint  Patient presents with  . Left Knee - Routine Post Op   Visit Diagnoses:  1. Injury of meniscus of left knee, initial encounter     Plan: [name] is now about 6 weeks out left knee arthroscopy with tendon repair and meniscal debridement.  She is been doing well.  She is doing about 45 minutes of home exercise a day working on strengthening.  She is down to 1 crutch.  On exam there is no calf tenderness.  She has excellent range of motion and no effusion.  Continue with range of motion exercises.  Follow-up with me in 6 weeks for clinical recheck.  Follow-Up Instructions: Return in about 6 weeks (around 04/03/2019).   Orders:  No orders of the defined types were placed in this encounter.  No orders of the defined types were placed in this encounter.   Imaging: No results found.  PMFS History: Patient Active Problem List   Diagnosis Date Noted  . Major depressive disorder, recurrent episode, moderate ([place]) 11/21/2018  . GAD (generalized anxiety disorder) 11/21/2018  . Panic disorder 11/21/2018   Past Medical History:  Diagnosis Date  . Anemia    while on BCP  . Anxiety   . Arthritis   . Asthma    as a toddler  . Depression   . 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 Alcohol abuse Maternal Uncle   . Dementia Maternal Grandmother     Past Surgical History:  Procedure Laterality Date  . KNEE ARTHROSCOPY Left 01/10/2019   Procedure: LEFT KNEE ARTHROSCOPY KNEE, MENISCAL DEBRIDEMENT;  Surgeon: Meredith Pel, MD;  Location: [place];  Service: Orthopedics;  Laterality: Left;  . TENDON REPAIR Left 01/10/2019   Procedure: Tendon Repair;  Surgeon: Meredith Pel, MD;  Location: [place];  Service: Orthopedics;  Laterality:  Left;  . TONSILLECTOMY     and adenoids removed  . WISDOM TOOTH EXTRACTION     Social History   Occupational History  . Not on file  Tobacco Use  . Smoking status: Never Smoker  . Smokeless tobacco: Never Used  Substance and Sexual Activity  . Alcohol use: Yes    Comment: occasional  . Drug use: Yes    Types: Marijuana    Comment: Last use 3-4 months prior  . Sexual activity: Not Currently

## 2019-03-29 ENCOUNTER — Ambulatory Visit (HOSPITAL_COMMUNITY): Payer: BLUE CROSS/BLUE SHIELD | Admitting: Psychiatry

## 2019-03-29 ENCOUNTER — Other Ambulatory Visit: Payer: Self-pay

## 2019-04-03 ENCOUNTER — Ambulatory Visit: Payer: BC Managed Care – PPO | Admitting: Orthopedic Surgery

## 2019-04-05 ENCOUNTER — Ambulatory Visit (INDEPENDENT_AMBULATORY_CARE_PROVIDER_SITE_OTHER): Payer: BC Managed Care – PPO | Admitting: Orthopedic Surgery

## 2019-04-05 ENCOUNTER — Encounter: Payer: Self-pay | Admitting: Orthopedic Surgery

## 2019-04-05 ENCOUNTER — Other Ambulatory Visit: Payer: Self-pay

## 2019-04-05 DIAGNOSIS — S838X2A Sprain of other specified parts of left knee, initial encounter: Secondary | ICD-10-CM

## 2019-04-05 NOTE — Progress Notes (Signed)
   Post-Op Visit Note   Patient: Laura Cowan           Date of Birth: 1995/10/12           MRN: [id number] Visit Date: 04/05/2019 PCP: Patient, No Pcp Per   Assessment & Plan:  Chief Complaint:  Chief Complaint  Patient presents with  . Left Knee - Follow-up   Visit Diagnoses:  1. Injury of meniscus of left knee, initial encounter     Plan: Laura Cowan is a patient with left knee arthroscopy and popliteal tendon repair.  She is 3 months out.  She is in physical therapy doing strengthening.  On exam she has good range of motion no effusion and good stability to varus valgus stress at 0 and 30 degrees as well as good rotational stability.  Narrow asymmetric difference with rotation at 30 degrees with external rotation left versus right.  Plan at this time is to continue therapy for 1 more month transitioning to home exercise.  She really needs to focus on quad strengthening in order to be able to return to any type of physical activities.  Follow-up with me as needed.  Follow-Up Instructions: Return if symptoms worsen or fail to improve.   Orders:  No orders of the defined types were placed in this encounter.  No orders of the defined types were placed in this encounter.   Imaging: No results found.  PMFS History: Patient Active Problem List   Diagnosis Date Noted  . Major depressive disorder, recurrent episode, moderate ([place]) 11/21/2018  . GAD (generalized anxiety disorder) 11/21/2018  . Panic disorder 11/21/2018   Past Medical History:  Diagnosis Date  . Anemia    while on BCP  . Anxiety   . Arthritis   . Asthma    as a toddler  . Depression   . 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 Alcohol abuse Maternal Uncle   . Dementia Maternal Grandmother     Past Surgical History:  Procedure Laterality Date  . KNEE ARTHROSCOPY Left 01/10/2019   Procedure: LEFT KNEE ARTHROSCOPY KNEE, MENISCAL DEBRIDEMENT;  Surgeon: Meredith Pel,  MD;  Location: [place];  Service: Orthopedics;  Laterality: Left;  . TENDON REPAIR Left 01/10/2019   Procedure: Tendon Repair;  Surgeon: Meredith Pel, MD;  Location: [place];  Service: Orthopedics;  Laterality: Left;  . TONSILLECTOMY     and adenoids removed  . WISDOM TOOTH EXTRACTION     Social History   Occupational History  . Not on file  Tobacco Use  . Smoking status: Never Smoker  . Smokeless tobacco: Never Used  Substance and Sexual Activity  . Alcohol use: Yes    Comment: occasional  . Drug use: Yes    Types: Marijuana    Comment: Last use 3-4 months prior  . Sexual activity: Not Currently

## 2019-08-24 ENCOUNTER — Ambulatory Visit (INDEPENDENT_AMBULATORY_CARE_PROVIDER_SITE_OTHER): Payer: Self-pay | Admitting: Psychiatry

## 2019-08-24 ENCOUNTER — Other Ambulatory Visit: Payer: Self-pay

## 2019-08-24 DIAGNOSIS — F411 Generalized anxiety disorder: Secondary | ICD-10-CM

## 2019-08-24 DIAGNOSIS — F410 Panic disorder [episodic paroxysmal anxiety] without agoraphobia: Secondary | ICD-10-CM

## 2019-08-24 MED ORDER — CLONAZEPAM 0.5 MG PO TABS: 0.5000 mg | ORAL_TABLET | Freq: Every evening | ORAL | 0 refills | Status: DC | PRN

## 2019-08-24 NOTE — Progress Notes (Signed)
BH MD/PA/NP OP Progress Note  08/24/2019 10:46 AM Laura Cowan  MRN:  [id number] Interview was conducted by phone and I verified that I was speaking with the correct person using two identifiers. I discussed the limitations of evaluation and management by telemedicine and  the availability of in person appointments. Patient expressed understanding and agreed to proceed.  Chief Complaint: Anxiety.  HPI: 76 yosingle AAF with symptoms consistent with GAD and panic disorder (also some depression) who has never been treated until middle of last year. She started to have SI on Lexapro; was in counseling but sessions were inconsistent. She has seen me twice early this year (last visit in April). She remains more nervous than depressed and continued to use clonazepam as needed for panic attacks - used it twice in the oast 3 weeks. Panic attacks can be triggered "by anything" e.g. something she hears on the news. No hx of mania or psychosis. 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 Lives with mother - relationship is good. Sandrika likes to draw to relax but has not done a lot of it lately. Hx of cannabis abuse - in early remission. We started Viibryd for depression/anxiety but after a good initial response she has not taken this medication for past 4 months or so. She has no health insurance (self pay) but would very much like to have a counselor again. She has a hx of cannabis abuse but has not used in close to a year.  Visit Diagnosis:    ICD-10-CM   1. Panic disorder  F41.0   2. GAD (generalized anxiety disorder)  F41.1     Past Psychiatric History: Please see intake H&P.  Past Medical History:  Past Medical History:  Diagnosis Date  . Anemia    while on BCP  . Anxiety   . Arthritis   . Asthma    as a toddler  . Depression   . 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 KNEE ARTHROSCOPY Left  01/10/2019   Procedure: LEFT KNEE ARTHROSCOPY KNEE, MENISCAL DEBRIDEMENT;  Surgeon: Cammy Copa, MD;  Location: [facility] OR;  Service: Orthopedics;  Laterality: Left;  . TENDON REPAIR Left 01/10/2019   Procedure: Tendon Repair;  Surgeon: Cammy Copa, MD;  Location: [facility] OR;  Service: Orthopedics;  Laterality: Left;  . TONSILLECTOMY     and adenoids removed  . WISDOM TOOTH EXTRACTION      Family Psychiatric History: Reviewed.  Family History:  Family History  Problem Relation Age of Onset  . Alcohol abuse Maternal Uncle   . Dementia Maternal Grandmother     Social History:  Social History   Socioeconomic History  . Marital status: Single    Spouse name: Not on file  . Number of children: Not on file  . Years of education: Not on file  . Highest education level: Not on file  Occupational History  . Not on file  Tobacco Use  . Smoking status: Never Smoker  . Smokeless tobacco: Never Used  Substance and Sexual Activity  . Alcohol use: Yes    Comment: occasional  . Drug use: Yes    Types: Marijuana    Comment: Last use 3-4 months prior  . Sexual activity: Not Currently  Other Topics Concern  . Not on file  Social History Narrative  . Not on file   Social Determinants of Health  Financial Resource Strain: Low Risk   . Difficulty of Paying Living Expenses: Not hard at all  Food Insecurity: No Food Insecurity  . Worried About Programme researcher, broadcasting/film/videounning Out of Food in the Last Year: Never true  . Ran Out of Food in the Last Year: Never true  Transportation Needs: Unmet Transportation Needs  . Lack of Transportation (Medical): No  . Lack of Transportation (Non-Medical): Yes  Physical Activity: Insufficiently Active  . Days of Exercise per Week: 5 days  . Minutes of Exercise per Session: 20 min  Stress: Stress Concern Present  . Feeling of Stress : To some extent  Social Connections: Severely Isolated  . Frequency of Communication with Friends and Family: Once a week  .  Frequency of Social Gatherings with Friends and Family: Never  . Attends Religious Services: Never  . Active Member of Clubs or Organizations: No  . Attends BankerClub or Organization Meetings: Never  . Marital Status: Never married    Allergies:  Allergies  Allergen Reactions  . Nexplanon [Etonogestrel] Itching, Swelling, Rash and Other (See Comments)    Swelling of face and glands, headaches, and a sore throat    Metabolic Disorder Labs: No results found for: HGBA1C, MPG No results found for: PROLACTIN No results found for: CHOL, TRIG, HDL, CHOLHDL, VLDL, LDLCALC No results found for: TSH  Therapeutic Level Labs: No results found for: LITHIUM No results found for: VALPROATE No components found for:  CBMZ  Current Medications: Current Outpatient Medications  Medication Sig Dispense Refill  . aspirin EC 81 MG tablet 1 po bid (Patient not taking: Reported on 01/05/2019) 42 tablet 0  . clonazePAM (KLONOPIN) 0.5 MG tablet Take 1 tablet (0.5 mg total) by mouth at bedtime as needed for anxiety (sleep). 90 tablet 0  . ferrous sulfate 325 (65 FE) MG tablet Take 325 mg by mouth daily with breakfast.    . metFORMIN (GLUCOPHAGE) 500 MG tablet Take 500 mg by mouth 2 (two) times daily with a meal.     . methocarbamol (ROBAXIN) 500 MG tablet 1 po q 8 hrs prn spasm (Patient taking differently: Take 500 mg by mouth every 8 (eight) hours as needed for muscle spasms. 1 po q 8 hrs prn spasm) 30 tablet 0  . methocarbamol (ROBAXIN) 500 MG tablet 1 po q d prn 30 tablet 0  . oxyCODONE (ROXICODONE) 5 MG immediate release tablet 1 po q 6 hrs prn pain (Patient taking differently: Take 5 mg by mouth every 6 (six) hours as needed (pain). 1 po q 6 hrs prn pain) 35 tablet 0  . oxyCODONE (ROXICODONE) 5 MG immediate release tablet 1 po bid prn 25 tablet 0  . traMADol (ULTRAM) 50 MG tablet 1 po q 8 hrs prn pain 30 tablet 0  . traMADol (ULTRAM) 50 MG tablet Take 1 tablet (50 mg total) by mouth every 8 (eight) hours as  needed. 45 tablet 0   No current facility-administered medications for this visit.     Psychiatric Specialty Exam: Review of Systems  Psychiatric/Behavioral: Positive for sleep disturbance.  All other systems reviewed and are negative.   There were no vitals taken for this visit.There is no height or weight on file to calculate BMI.  General Appearance: NA  Eye Contact:  NA  Speech:  Clear and Coherent and Normal Rate  Volume:  Decreased  Mood:  Anxious  Affect:  NA  Thought Process:  Goal Directed and Linear  Orientation:  Full (Time, Place, and Person)  Thought Content: Logical   Suicidal Thoughts:  No  Homicidal Thoughts:  No  Memory:  Immediate;   Good Recent;   Good Remote;   Good  Judgement:  Fair  Insight:  Fair  Psychomotor Activity:  NA  Concentration:  Concentration: Good  Recall:  Good  Fund of Knowledge: Good  Language: Good  Akathisia:  Negative  Handed:  Right  AIMS (if indicated): not done  Assets:  Communication Skills Desire for Improvement Housing Resilience Social Support Talents/Skills  ADL's:  Intact  Cognition: WNL  Sleep:  Fair    Assessment and Plan: 23 yosingle AAF with symptoms consistent with GAD and panic disorder (also some depression) who has never been treated until middle of last year. She started to have SI on Lexapro; was in counseling but sessions were inconsistent. She has seen me twice early this year (last visit in April). She remains more nervous than depressed and continued to use clonazepam as needed for panic attacks - used it twice in the oast 3 weeks. Panic attacks can be triggered "by anything" e.g. something she hears on the news. No hx of mania or psychosis. 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 Lives with mother - relationship is good. Nalaya likes to draw to relax but has not done a lot of it lately. Hx of cannabis abuse - in early remission. We started  Viibryd for depression/anxiety but after a good initial response she has not taken this medication for past 4 months or so. She has no health insurance (self pay) but would very much like to have a counselor again.  Dx: GAD/Panic disorder   Plan: Continue clonazepam 0.5 mg daily prn anxiety (or insomnia). I will ask for our appointment staff to contact her about her options regarding counseling. Next med management visit in 3 months. The plan was discussed with patient who had an opportunity to ask questions and these were all answered. I spend 25 minutes in phone consultation with the patient.    Stephanie Acre, MD 08/24/2019, 10:46 AM

## 2019-11-22 ENCOUNTER — Ambulatory Visit (HOSPITAL_COMMUNITY): Payer: Self-pay | Admitting: Psychiatry

## 2019-11-22 ENCOUNTER — Other Ambulatory Visit: Payer: Self-pay

## 2021-03-09 IMAGING — MR MRI OF THE LEFT KNEE WITHOUT CONTRAST
6 series · 39 of 40 positions shown · non-contrast
Comparison: Plain films left knee 12/23/2018.

CLINICAL DATA: The patient suffered a left knee twisting injury in
a slip and fall 12/23/2018 with onset pain, swelling and weakness.
Subsequent encounter.

EXAM:
MRI OF THE LEFT KNEE WITHOUT CONTRAST
TECHNIQUE: Multiplanar, multisequence MR imaging of the knee was performed. No
intravenous contrast was administered.

[Series 6: T2 fat-sat · axial · left · 4.0mm · 0.53mm/px · z∈[-29,+125]mm · 8 of 36 slices shown (1 of 3)]
[im 1/36]
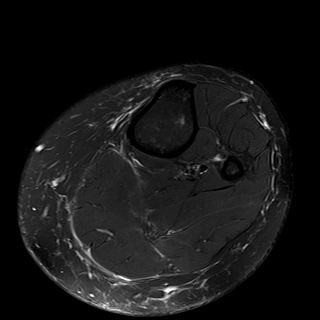
[im 6/36]
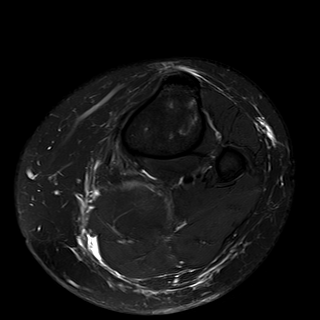
[im 11/36]
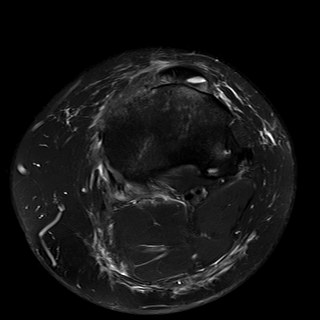
[im 16/36]
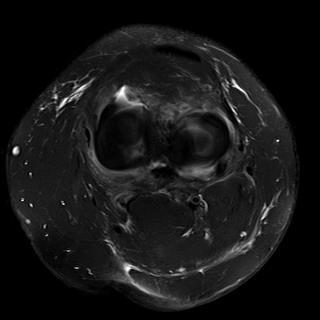
[im 21/36]
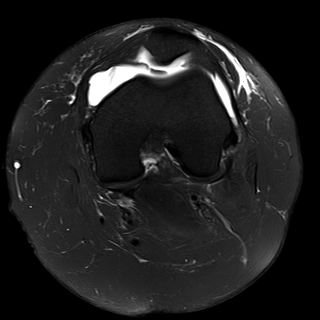
[im 26/36]
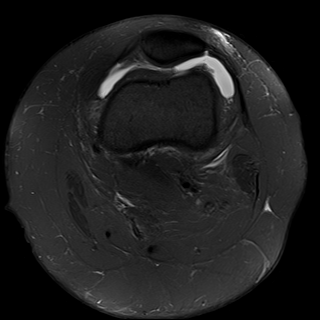
[im 31/36]
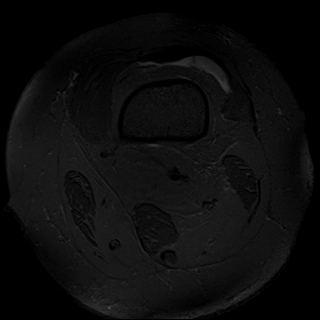
[im 36/36]
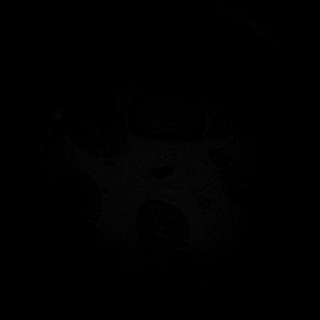

[Series 7: T2 fat-sat · coronal · left · 4.0mm · 0.44mm/px · 6 of 32 slices shown (2 of 3)]
[im 1/32]
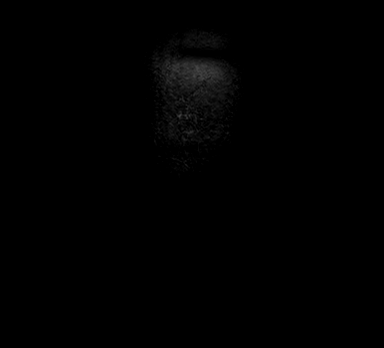
[im 7/32]
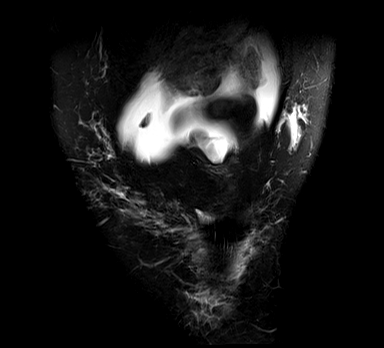
[im 13/32]
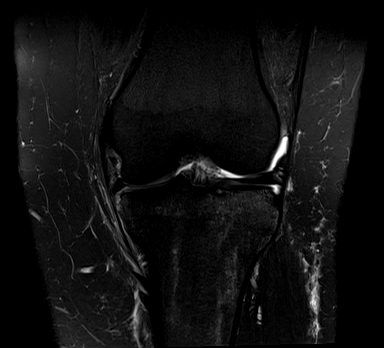
[im 19/32]
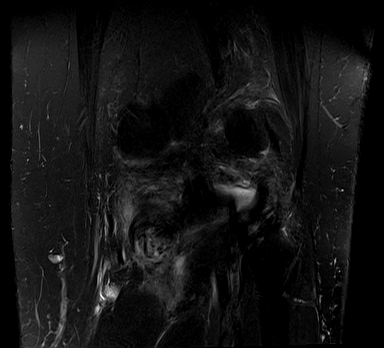
[im 25/32]
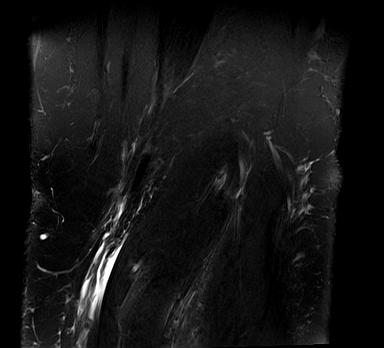
[im 32/32]
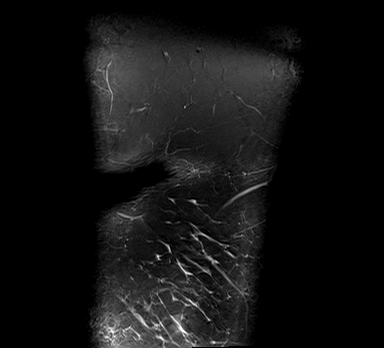

[Series 8: T1 · coronal · left · 4.0mm · 0.44mm/px · 5 of 32 slices shown]
[im 1/32]
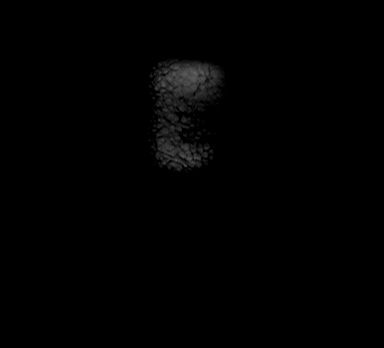
[im 7/32]
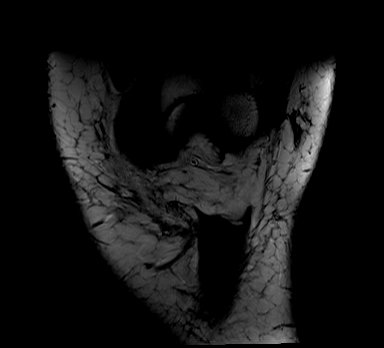
[im 13/32]
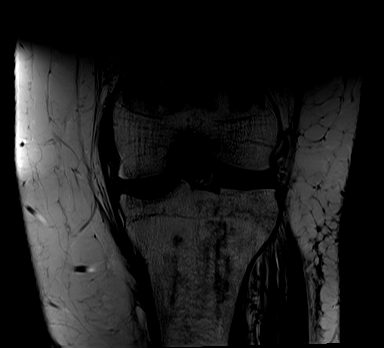
[im 19/32]
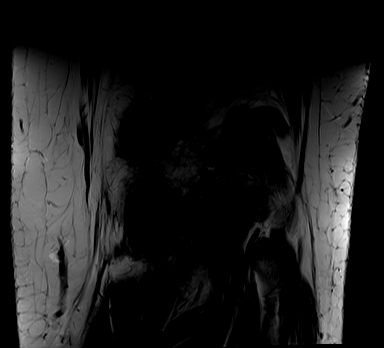
[im 25/32]
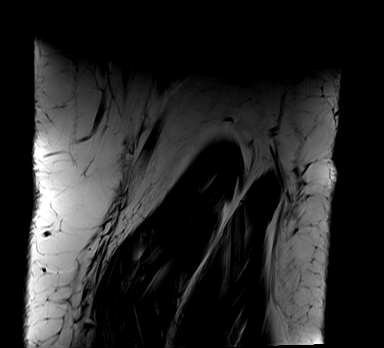

[Series 9: PD fat-sat · coronal · left · 3.0mm · 0.53mm/px · 8 of 38 slices shown (1 of 2)]
[im 1/38]
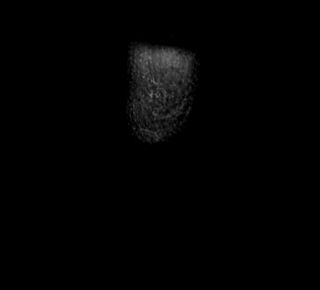
[im 6/38]
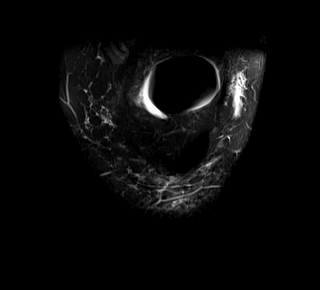
[im 11/38]
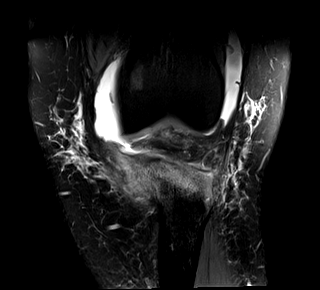
[im 16/38]
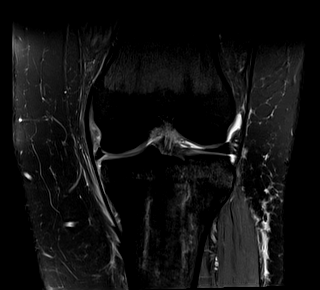
[im 22/38]
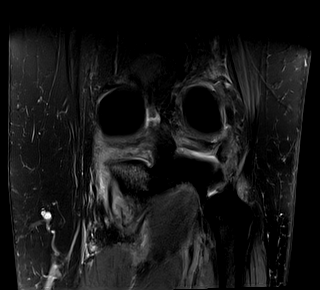
[im 27/38]
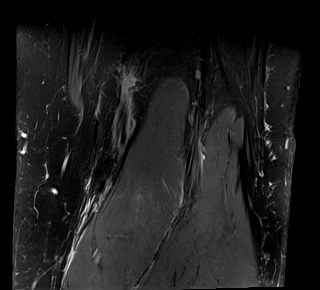
[im 32/38]
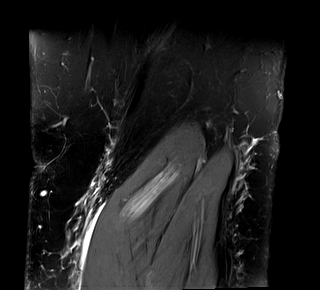
[im 38/38]
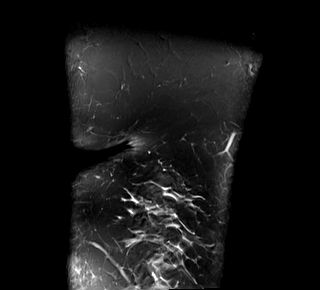

[Series 10: PD fat-sat · sagittal · left · 3.0mm · 0.44mm/px · 6 of 30 slices shown (2 of 2)]
[im 1/30]
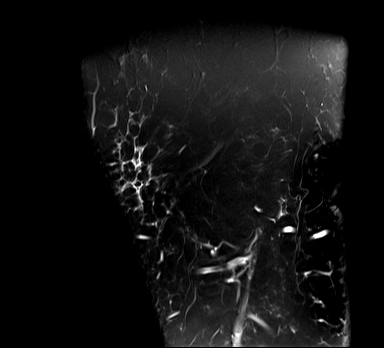
[im 6/30]
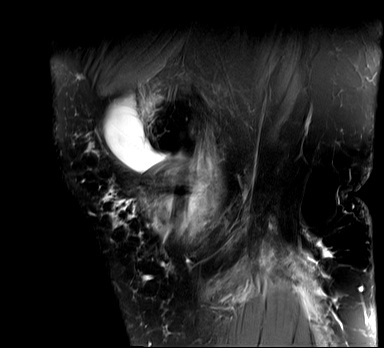
[im 12/30]
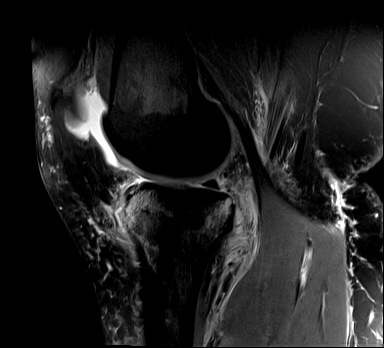
[im 18/30]
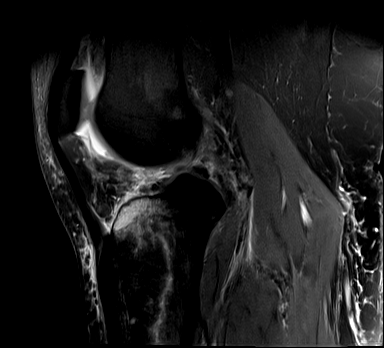
[im 24/30]
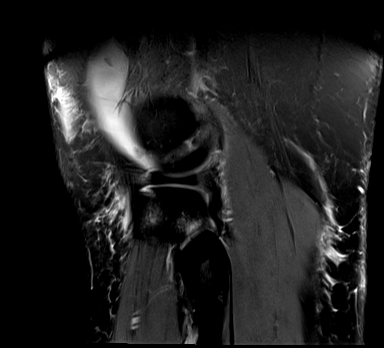
[im 30/30]
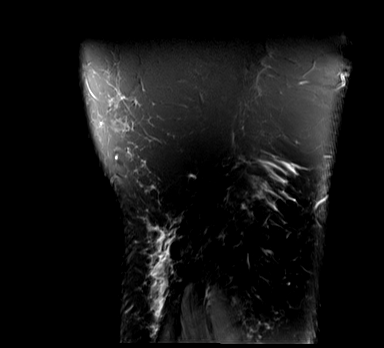

[Series 11: T2 fat-sat · sagittal · left · 3.0mm · 0.44mm/px · 6 of 30 slices shown (3 of 3)]
[im 1/30]
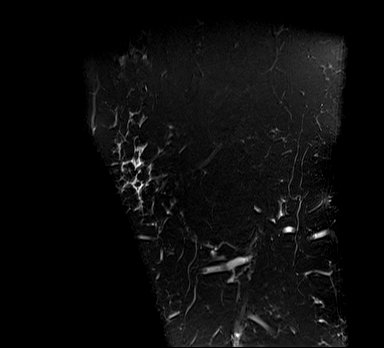
[im 6/30]
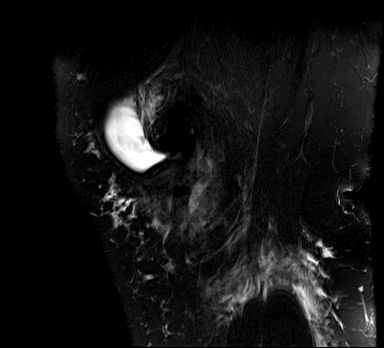
[im 12/30]
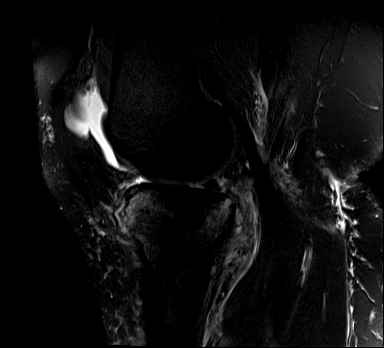
[im 18/30]
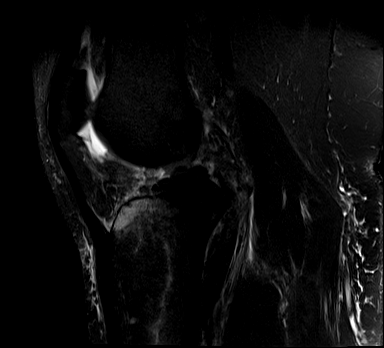
[im 24/30]
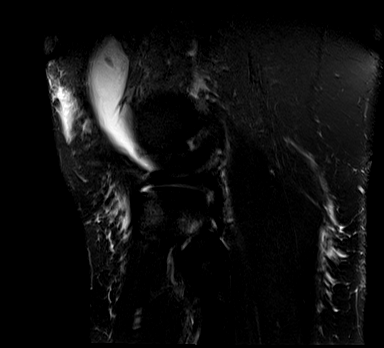
[im 30/30]
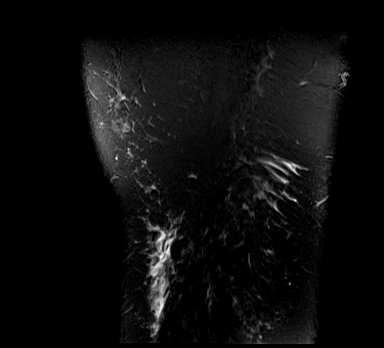

[39 of 40 positions shown; findings below may reference images not displayed]

FINDINGS: MENISCI

Medial meniscus: A tiny radial tear is seen along the free edge of
the posterior body of the medial meniscus on image 21 of series 9.

Lateral meniscus:  Intact.

LIGAMENTS

Cruciates:  Intact.

Collaterals: Intrasubstance edema is seen in the distal fibers of
the conjoined fibular collateral ligament and biceps femoris tendon
consistent with sprain/strain without tear. The popliteus tendon is
almost completely torn from its attachment to the femur. The tendon
has a lax configuration as seen on image 24 of series 10. There is
some fluid about the medial collateral ligament consistent with
grade 1 sprain. The ligament is intact.

CARTILAGE

Patellofemoral:  Normal.

Medial:  Normal.

Lateral:  Normal.

Joint:  Small effusion.

Popliteal Fossa:  No Baker's cyst.

Extensor Mechanism:  Intact.

Bones: No fracture. There are bone contusions in both the medial and
lateral tibia.

Other: None.
IMPRESSION: Intrasubstance increased T2 signal in the conjoined biceps femoris
tendon and fibular collateral ligament consistent with sprain/strain
without tear.

Complete or near complete tear of the popliteus tendon from its
attachment to the femur.

Grade 1 MCL sprain without tear.

Tiny radial tear along the free edge of the medial meniscus in the
posterior body.

Intact cruciate ligaments.

Bone contusions in the tibia without fracture.

## 2021-03-25 ENCOUNTER — Emergency Department (HOSPITAL_COMMUNITY)
Admission: EM | Admit: 2021-03-25 | Discharge: 2021-03-26 | Disposition: A | Discharge status: Home / Self Care | Payer: Self-pay | Attending: Emergency Medicine | Admitting: Emergency Medicine

## 2021-03-25 ENCOUNTER — Other Ambulatory Visit: Payer: Self-pay

## 2021-03-25 DIAGNOSIS — K6289 Other specified diseases of anus and rectum: Secondary | ICD-10-CM | POA: Insufficient documentation

## 2021-03-25 DIAGNOSIS — Z5321 Procedure and treatment not carried out due to patient leaving prior to being seen by health care provider: Secondary | ICD-10-CM | POA: Insufficient documentation

## 2021-03-25 NOTE — ED Provider Notes (Signed)
Emergency Medicine Provider Triage Evaluation Note  Laura Cowan , a 25 y.o. female  was evaluated in triage.  Pt complains of rectal pain x about a week.  Denies blood stools.  Pt. Concerned about hemorrhoid.  Denies any penetrating trauma.  Onset after "forcefully passing gas" a week ago.  States "it feels raw."  Review of Systems  Positive: Rectal pain Negative: Bloody stools, fever  Physical Exam  BP 122/77 (BP Location: Right Arm)   Pulse 88   Temp 98.3 F (36.8 C) (Oral)   Resp 16   SpO2 100%  Gen:   Awake, no distress   Resp:  Normal effort  MSK:   Moves extremities without difficulty  Other:    Medical Decision Making  Medically screening exam initiated at 11:20 PM.  Appropriate orders placed.  Shyenne Maggard was informed that the remainder of the evaluation will be completed by another provider, this initial triage assessment does not replace that evaluation, and the importance of remaining in the ED until their evaluation is complete.  Rectal pain   Roxy Horseman, PA-C 03/25/21 2321    Melene Plan, DO 03/26/21 1459

## 2021-03-25 NOTE — ED Triage Notes (Addendum)
Pt c/o ongoing rectal pain since June 30th. Denies penetration/injury. Pain with sitting. States having problems with moving bowels. No current bleeding. Denies abdominal pain.
